# Patient Record
Sex: Male | Born: 1960 | Race: Black or African American | Hispanic: No | Marital: Single | Smoking: Current some day smoker
Health system: Southern US, Community
[De-identification: ages and names within clinical notes are randomized; demographics above are authoritative.]

## PROBLEM LIST (undated history)

## (undated) DIAGNOSIS — G5603 Carpal tunnel syndrome, bilateral upper limbs: Secondary | ICD-10-CM

## (undated) DIAGNOSIS — B182 Chronic viral hepatitis C: Secondary | ICD-10-CM

## (undated) DIAGNOSIS — M1990 Unspecified osteoarthritis, unspecified site: Secondary | ICD-10-CM

---

## 2012-11-03 ENCOUNTER — Emergency Department (HOSPITAL_COMMUNITY)
Admission: EM | Admit: 2012-11-03 | Discharge: 2012-11-03 | Disposition: A | Discharge status: Home / Self Care | Payer: No Typology Code available for payment source | Attending: Emergency Medicine | Admitting: Emergency Medicine

## 2012-11-03 ENCOUNTER — Encounter (HOSPITAL_COMMUNITY): Payer: Self-pay | Admitting: Cardiology

## 2012-11-03 DIAGNOSIS — F17200 Nicotine dependence, unspecified, uncomplicated: Secondary | ICD-10-CM | POA: Insufficient documentation

## 2012-11-03 DIAGNOSIS — M2569 Stiffness of other specified joint, not elsewhere classified: Secondary | ICD-10-CM | POA: Insufficient documentation

## 2012-11-03 DIAGNOSIS — S161XXA Strain of muscle, fascia and tendon at neck level, initial encounter: Secondary | ICD-10-CM

## 2012-11-03 DIAGNOSIS — S139XXA Sprain of joints and ligaments of unspecified parts of neck, initial encounter: Secondary | ICD-10-CM | POA: Insufficient documentation

## 2012-11-03 DIAGNOSIS — Y9389 Activity, other specified: Secondary | ICD-10-CM | POA: Insufficient documentation

## 2012-11-03 DIAGNOSIS — Y92410 Unspecified street and highway as the place of occurrence of the external cause: Secondary | ICD-10-CM | POA: Insufficient documentation

## 2012-11-03 MED ORDER — METHOCARBAMOL 500 MG PO TABS: 500.0000 mg | ORAL_TABLET | Freq: Two times a day (BID) | ORAL | Status: DC

## 2012-11-03 MED ORDER — NAPROXEN 500 MG PO TABS: 500.0000 mg | ORAL_TABLET | Freq: Two times a day (BID) | ORAL | Status: DC
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
## 2012-11-04 NOTE — ED Provider Notes (Signed)
Medical screening examination/treatment/procedure(s) were performed by non-physician practitioner and as supervising physician I was immediately available for consultation/collaboration.  Gerica Koble B. Bernette Mayers, MD 11/04/12 1610

## 2012-11-06 ENCOUNTER — Emergency Department (HOSPITAL_COMMUNITY)
Admission: EM | Admit: 2012-11-06 | Discharge: 2012-11-06 | Disposition: A | Discharge status: Home / Self Care | Payer: No Typology Code available for payment source | Attending: Emergency Medicine | Admitting: Emergency Medicine

## 2012-11-06 ENCOUNTER — Encounter (HOSPITAL_COMMUNITY): Payer: Self-pay | Admitting: Emergency Medicine

## 2012-11-06 ENCOUNTER — Emergency Department (HOSPITAL_COMMUNITY): Payer: No Typology Code available for payment source

## 2012-11-06 DIAGNOSIS — M542 Cervicalgia: Secondary | ICD-10-CM | POA: Insufficient documentation

## 2012-11-06 DIAGNOSIS — F17200 Nicotine dependence, unspecified, uncomplicated: Secondary | ICD-10-CM | POA: Insufficient documentation

## 2012-11-06 DIAGNOSIS — G8911 Acute pain due to trauma: Secondary | ICD-10-CM | POA: Insufficient documentation

## 2012-11-06 MED ORDER — METHOCARBAMOL 500 MG PO TABS: 500.0000 mg | ORAL_TABLET | Freq: Two times a day (BID) | ORAL | Status: DC

## 2012-11-06 NOTE — ED Notes (Signed)
Patient presents to ED today with complaints of neck pain. Was seen here on the 7th for the same.
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
## 2012-11-07 NOTE — ED Provider Notes (Signed)
Medical screening examination/treatment/procedure(s) were performed by non-physician practitioner and as supervising physician I was immediately available for consultation/collaboration.  Braelin Costlow L Denise Washburn, MD 11/07/12 1017 

## 2013-01-02 ENCOUNTER — Emergency Department: Payer: Self-pay | Admitting: Emergency Medicine

## 2013-05-25 ENCOUNTER — Emergency Department: Payer: Self-pay | Admitting: Emergency Medicine

## 2014-03-16 ENCOUNTER — Emergency Department (HOSPITAL_COMMUNITY)
Admission: EM | Admit: 2014-03-16 | Discharge: 2014-03-16 | Disposition: A | Discharge status: Home / Self Care | Payer: Self-pay | Attending: Emergency Medicine | Admitting: Emergency Medicine

## 2014-03-16 ENCOUNTER — Encounter (HOSPITAL_COMMUNITY): Payer: Self-pay | Admitting: *Deleted

## 2014-03-16 ENCOUNTER — Emergency Department (HOSPITAL_COMMUNITY): Payer: No Typology Code available for payment source

## 2014-03-16 DIAGNOSIS — Z79899 Other long term (current) drug therapy: Secondary | ICD-10-CM | POA: Insufficient documentation

## 2014-03-16 DIAGNOSIS — Z791 Long term (current) use of non-steroidal anti-inflammatories (NSAID): Secondary | ICD-10-CM | POA: Insufficient documentation

## 2014-03-16 DIAGNOSIS — J029 Acute pharyngitis, unspecified: Secondary | ICD-10-CM | POA: Insufficient documentation

## 2014-03-16 DIAGNOSIS — R0602 Shortness of breath: Secondary | ICD-10-CM | POA: Insufficient documentation

## 2014-03-16 DIAGNOSIS — R0789 Other chest pain: Secondary | ICD-10-CM | POA: Insufficient documentation

## 2014-03-16 DIAGNOSIS — H9203 Otalgia, bilateral: Secondary | ICD-10-CM | POA: Insufficient documentation

## 2014-03-16 DIAGNOSIS — R197 Diarrhea, unspecified: Secondary | ICD-10-CM | POA: Insufficient documentation

## 2014-03-16 DIAGNOSIS — Z8619 Personal history of other infectious and parasitic diseases: Secondary | ICD-10-CM | POA: Insufficient documentation

## 2014-03-16 DIAGNOSIS — Z720 Tobacco use: Secondary | ICD-10-CM | POA: Insufficient documentation

## 2014-03-16 DIAGNOSIS — R6889 Other general symptoms and signs: Secondary | ICD-10-CM

## 2014-03-16 HISTORY — DX: Chronic viral hepatitis C: B18.2

## 2014-03-16 LAB — RAPID STREP SCREEN (MED CTR MEBANE ONLY): STREPTOCOCCUS, GROUP A SCREEN (DIRECT): NEGATIVE

## 2014-03-16 LAB — CBC WITH DIFFERENTIAL/PLATELET
BASOS ABS: 0 10*3/uL (ref 0.0–0.1)
Basophils Relative: 0 % (ref 0–1)
Eosinophils Absolute: 0.1 10*3/uL (ref 0.0–0.7)
Eosinophils Relative: 3 % (ref 0–5)
HEMATOCRIT: 36.5 % — AB (ref 39.0–52.0)
Hemoglobin: 12.3 g/dL — ABNORMAL LOW (ref 13.0–17.0)
LYMPHS PCT: 40 % (ref 12–46)
Lymphs Abs: 1.7 10*3/uL (ref 0.7–4.0)
MCH: 32.5 pg (ref 26.0–34.0)
MCHC: 33.7 g/dL (ref 30.0–36.0)
MCV: 96.3 fL (ref 78.0–100.0)
MONO ABS: 0.6 10*3/uL (ref 0.1–1.0)
Monocytes Relative: 13 % — ABNORMAL HIGH (ref 3–12)
NEUTROS ABS: 1.9 10*3/uL (ref 1.7–7.7)
NEUTROS PCT: 44 % (ref 43–77)
PLATELETS: 223 10*3/uL (ref 150–400)
RBC: 3.79 MIL/uL — ABNORMAL LOW (ref 4.22–5.81)
RDW: 13.9 % (ref 11.5–15.5)
WBC: 4.3 10*3/uL (ref 4.0–10.5)

## 2014-03-16 LAB — COMPREHENSIVE METABOLIC PANEL
ALK PHOS: 68 U/L (ref 39–117)
ALT: 129 U/L — AB (ref 0–53)
AST: 71 U/L — AB (ref 0–37)
Albumin: 3.6 g/dL (ref 3.5–5.2)
Anion gap: 12 (ref 5–15)
BILIRUBIN TOTAL: 0.4 mg/dL (ref 0.3–1.2)
BUN: 21 mg/dL (ref 6–23)
CHLORIDE: 102 meq/L (ref 96–112)
CO2: 25 meq/L (ref 19–32)
Calcium: 9.4 mg/dL (ref 8.4–10.5)
Creatinine, Ser: 0.8 mg/dL (ref 0.50–1.35)
GFR calc Af Amer: >90 mL/min (ref >90)
Glucose, Bld: 94 mg/dL (ref 70–99)
POTASSIUM: 4.2 meq/L (ref 3.7–5.3)
SODIUM: 139 meq/L (ref 137–147)
Total Protein: 7.3 g/dL (ref 6.0–8.3)

## 2014-03-16 LAB — TROPONIN I: Troponin I: <0.3 ng/mL (ref <0.30)

## 2014-03-16 MED ORDER — IPRATROPIUM-ALBUTEROL 0.5-2.5 (3) MG/3ML IN SOLN
3.0000 mL | RESPIRATORY_TRACT | Status: DC
  Administered 2014-03-16: 3 mL via RESPIRATORY_TRACT
  Filled 2014-03-16: qty 3

## 2014-03-16 MED ORDER — NAPROXEN 500 MG PO TABS: 500.0000 mg | ORAL_TABLET | Freq: Two times a day (BID) | ORAL | Status: DC

## 2014-03-16 MED ORDER — GUAIFENESIN 100 MG/5ML PO LIQD: 100.0000 mg | ORAL | Status: DC | PRN

## 2014-03-16 MED ORDER — GI COCKTAIL ~~LOC~~
30.0000 mL | Freq: Once | ORAL | Status: AC
  Administered 2014-03-16: 30 mL via ORAL
  Filled 2014-03-16: qty 30

## 2014-03-16 NOTE — ED Notes (Signed)
Patient transported to X-ray 

## 2014-03-16 NOTE — Discharge Instructions (Signed)
Please follow up with your primary care physician in 1-2 days. If you do not have one please call the [facility] number listed above. Please take medications as prescribed. Please read all discharge instructions and return precautions.   Influenza Influenza ("the flu") is a viral infection of the respiratory tract. It occurs more often in winter months because people spend more time in close contact with one another. Influenza can make you feel very sick. Influenza easily spreads from person to person (contagious). CAUSES  Influenza is caused by a virus that infects the respiratory tract. You can catch the virus by breathing in droplets from an infected person's cough or sneeze. You can also catch the virus by touching something that was recently contaminated with the virus and then touching your mouth, nose, or eyes. RISKS AND COMPLICATIONS You may be at risk for a more severe case of influenza if you smoke cigarettes, have diabetes, have chronic heart disease (such as heart failure) or lung disease (such as asthma), or if you have a weakened immune system. Elderly people and pregnant women are also at risk for more serious infections. The most common problem of influenza is a lung infection (pneumonia). Sometimes, this problem can require emergency medical care and may be life threatening. SIGNS AND SYMPTOMS  Symptoms typically last 4 to 10 days and may include:  Fever.  Chills.  Headache, body aches, and muscle aches.  Sore throat.  Chest discomfort and cough.  Poor appetite.  Weakness or feeling tired.  Dizziness.  Nausea or vomiting. DIAGNOSIS  Diagnosis of influenza is often made based on your history and a physical exam. A nose or throat swab test can be done to confirm the diagnosis. TREATMENT  In mild cases, influenza goes away on its own. Treatment is directed at relieving symptoms. For more severe cases, your health care provider may prescribe antiviral  medicines to shorten the sickness. Antibiotic medicines are not effective because the infection is caused by a virus, not by bacteria. HOME CARE INSTRUCTIONS  Take medicines only as directed by your health care provider.  Use a cool mist humidifier to make breathing easier.  Get plenty of rest until your temperature returns to normal. This usually takes 3 to 4 days.  Drink enough fluid to keep your urine clear or pale yellow.  Cover yourmouth and nosewhen coughing or sneezing,and wash your handswellto prevent thevirusfrom spreading.  Stay homefromwork orschool untilthe fever is gonefor at least 48full day. PREVENTION  An annual influenza vaccination (flu shot) is the best way to avoid getting influenza. An annual flu shot is now routinely recommended for all adults in the [place] IF:  You experiencechest pain, yourcough worsens,or you producemore mucus.  Youhave nausea,vomiting, ordiarrhea.  Your fever returns or gets worse. SEEK IMMEDIATE MEDICAL CARE IF:  You havetrouble breathing, you become short of breath,or your skin ornails becomebluish.  You have severe painor stiffnessin the neck.  You develop a sudden headache, or pain in the face or ear.  You have nausea or vomiting that you cannot control. MAKE SURE YOU:   Understand these instructions.  Will watch your condition.  Will get help right away if you are not doing well or get worse. Document Released: 03/13/2000 Document Revised: 07/31/2013 Document Reviewed: 06/15/2011 [facility] Patient Information 2015 [place], [place]. This information is not intended to replace advice given to you by your health care provider. Make sure you discuss any questions you have with your health  care provider.

## 2014-03-16 NOTE — ED Notes (Signed)
PA at the bedside.

## 2014-03-16 NOTE — ED Notes (Signed)
Patient returned from X-ray 

## 2014-03-16 NOTE — ED Notes (Signed)
The pt keeps clearing his throat and he feels like its closing up on him.  No resp distress

## 2014-03-16 NOTE — ED Notes (Signed)
The pt is c/o having a cold sorethroat  Chills and poss fever with dizziness foir 2-3 daYS .Marland Kitchen Non-productive cough  .

## 2014-03-16 NOTE — ED Provider Notes (Signed)
CSN: [id number]     Arrival date & time 03/16/14  2505 History   First MD Initiated Contact with Patient 03/16/14 1840     Chief Complaint  Patient presents with  . Dizziness     (Consider location/radiation/quality/duration/timing/severity/associated sxs/prior Treatment) HPI Comments: Patient is a 53 yo M PMHx significant for Hep C presenting to the ED for two days of subjective fevers, chills, non productive cough, sore throat, congestion, bilateral ear pain, nonbloody diarrhea. He states today he felt lightheaded and had some posttussive chest tightness and shortness of breath. He has tried 1 dose of a cough suppressant with no improvement. No modifying factors identified. He states he has positive sick contacts at home. He denies any nausea, vomiting, abdominal pain. 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  Patient is a 53 y.o. male presenting with dizziness.  Dizziness Associated symptoms: diarrhea and shortness of breath   Associated symptoms: no blood in stool, no nausea and no vomiting     Past Medical History  Diagnosis Date  . Hep C w/o coma, chronic    History reviewed. No pertinent past surgical history. No family history on file. History  Substance Use Topics  . Smoking status: Current Every Day Smoker    Types: Cigarettes  . Smokeless tobacco: Not on file  . Alcohol Use: No    Review of Systems  Constitutional: Positive for fever (subjective) and chills.  Respiratory: Positive for cough, chest tightness and shortness of breath.   Gastrointestinal: Positive for diarrhea. Negative for nausea, vomiting, abdominal pain, blood in stool and anal bleeding.  Musculoskeletal: Positive for myalgias and arthralgias.  Neurological: Positive for dizziness and light-headedness.  All other systems reviewed and are negative.     Allergies  Ibuprofen  Home Medications   Prior to Admission medications   Medication Sig Start Date End Date  Taking? Authorizing Provider  methocarbamol (ROBAXIN) 500 MG tablet Take 1 tablet (500 mg total) by mouth 2 (two) times daily. 2 tablets up to 4 times a day 11/06/12   Elmyra Ricks Pisciotta, PA-C  naproxen (NAPROSYN) 500 MG tablet Take 1 tablet (500 mg total) by mouth 2 (two) times daily. 11/03/12   Heather Laisure, PA-C   BP 130/80 mmHg  Pulse 72  Temp(Src) 98.1 F (36.7 C) (Oral)  Resp 17  SpO2 99% Physical Exam  Constitutional: He is oriented to person, place, and time. He appears well-developed and well-nourished. No distress.  HENT:  Head: Normocephalic and atraumatic.  Right Ear: Hearing, tympanic membrane, external ear and ear canal normal.  Left Ear: Hearing, tympanic membrane, external ear and ear canal normal.  Nose: Rhinorrhea present.  Mouth/Throat: Uvula is midline and mucous membranes are normal. Posterior oropharyngeal erythema present. No oropharyngeal exudate, posterior oropharyngeal edema or tonsillar abscesses.  Eyes: Conjunctivae are normal.  Neck: Normal range of motion. Neck supple.  Cardiovascular: Normal rate, regular rhythm and normal heart sounds.   Pulmonary/Chest: Effort normal and breath sounds normal. No accessory muscle usage. No respiratory distress.  Cough on examination.   Abdominal: Soft. There is no tenderness.  Musculoskeletal: Normal range of motion.  Lymphadenopathy:    He has cervical adenopathy.  Neurological: He is alert and oriented to person, place, and time.  Skin: Skin is warm and dry. He is not diaphoretic.  Psychiatric: He has a normal mood and affect.  Nursing note and vitals reviewed.   ED Course  Procedures (including critical care time) Medications  ipratropium-albuterol (  DUONEB) 0.5-2.5 (3) MG/3ML nebulizer solution 3 mL (not administered)    Labs Review Labs Reviewed - No data to display  Imaging Review No results found.   EKG Interpretation None      MDM   Final diagnoses:  None    Filed Vitals:   03/16/14 2145   BP: 146/73  Pulse: 81  Temp:   Resp:    Afebrile, NAD, non-toxic appearing, AAOx4.  Patient with symptoms consistent with influenza.  Vitals are stable, low-grade fever.  No signs of dehydration, tolerating PO's.  Lungs are clear. CXR negative. Rapid strep negative. Labs reviewed.  Discussed the cost versus benefit of Tamiflu treatment with the patient.  The patient understands that symptoms are greater than the recommended 24-48 hour window of treatment.  Patient will be discharged with instructions to orally hydrate, rest.  Patient will also be given a cough suppressant.  Patient is stable at time of discharge     Harlow Mares, PA-C 03/16/14 [place], MD 03/16/14 2234

## 2014-03-18 LAB — CULTURE, GROUP A STREP

## 2014-05-13 ENCOUNTER — Emergency Department (HOSPITAL_COMMUNITY): Payer: Self-pay

## 2014-05-13 ENCOUNTER — Emergency Department (HOSPITAL_COMMUNITY)
Admission: EM | Admit: 2014-05-13 | Discharge: 2014-05-13 | Disposition: A | Discharge status: Home / Self Care | Payer: Self-pay | Attending: Emergency Medicine | Admitting: Emergency Medicine

## 2014-05-13 ENCOUNTER — Encounter (HOSPITAL_COMMUNITY): Payer: Self-pay | Admitting: Emergency Medicine

## 2014-05-13 DIAGNOSIS — R42 Dizziness and giddiness: Secondary | ICD-10-CM | POA: Insufficient documentation

## 2014-05-13 DIAGNOSIS — Z87891 Personal history of nicotine dependence: Secondary | ICD-10-CM | POA: Insufficient documentation

## 2014-05-13 DIAGNOSIS — R51 Headache: Secondary | ICD-10-CM | POA: Insufficient documentation

## 2014-05-13 DIAGNOSIS — Z79899 Other long term (current) drug therapy: Secondary | ICD-10-CM | POA: Insufficient documentation

## 2014-05-13 DIAGNOSIS — Z792 Long term (current) use of antibiotics: Secondary | ICD-10-CM | POA: Insufficient documentation

## 2014-05-13 DIAGNOSIS — Z8619 Personal history of other infectious and parasitic diseases: Secondary | ICD-10-CM | POA: Insufficient documentation

## 2014-05-13 LAB — CBC WITH DIFFERENTIAL/PLATELET
BASOS ABS: 0 10*3/uL (ref 0.0–0.1)
BASOS PCT: 0 % (ref 0–1)
Eosinophils Absolute: 0.1 10*3/uL (ref 0.0–0.7)
Eosinophils Relative: 3 % (ref 0–5)
HEMATOCRIT: 38.5 % — AB (ref 39.0–52.0)
HEMOGLOBIN: 13.4 g/dL (ref 13.0–17.0)
Lymphocytes Relative: 62 % — ABNORMAL HIGH (ref 12–46)
Lymphs Abs: 2.5 10*3/uL (ref 0.7–4.0)
MCH: 32.3 pg (ref 26.0–34.0)
MCHC: 34.8 g/dL (ref 30.0–36.0)
MCV: 92.8 fL (ref 78.0–100.0)
MONOS PCT: 11 % (ref 3–12)
Monocytes Absolute: 0.4 10*3/uL (ref 0.1–1.0)
NEUTROS ABS: 0.9 10*3/uL — AB (ref 1.7–7.7)
NEUTROS PCT: 24 % — AB (ref 43–77)
PLATELETS: 233 10*3/uL (ref 150–400)
RBC: 4.15 MIL/uL — AB (ref 4.22–5.81)
RDW: 12.3 % (ref 11.5–15.5)
WBC: 3.9 10*3/uL — ABNORMAL LOW (ref 4.0–10.5)

## 2014-05-13 LAB — I-STAT CHEM 8, ED
BUN: 15 mg/dL (ref 6–23)
CALCIUM ION: 1.2 mmol/L (ref 1.12–1.23)
CHLORIDE: 101 mmol/L (ref 96–112)
Creatinine, Ser: 0.8 mg/dL (ref 0.50–1.35)
GLUCOSE: 91 mg/dL (ref 70–99)
HCT: 43 % (ref 39.0–52.0)
Hemoglobin: 14.6 g/dL (ref 13.0–17.0)
Potassium: 3.9 mmol/L (ref 3.5–5.1)
SODIUM: 140 mmol/L (ref 135–145)
TCO2: 26 mmol/L (ref 0–100)

## 2014-05-13 LAB — COMPREHENSIVE METABOLIC PANEL
ALK PHOS: 59 U/L (ref 39–117)
ALT: 135 U/L — ABNORMAL HIGH (ref 0–53)
AST: 89 U/L — AB (ref 0–37)
Albumin: 3.9 g/dL (ref 3.5–5.2)
Anion gap: 6 (ref 5–15)
BILIRUBIN TOTAL: 0.8 mg/dL (ref 0.3–1.2)
BUN: 12 mg/dL (ref 6–23)
CALCIUM: 9.6 mg/dL (ref 8.4–10.5)
CO2: 27 mmol/L (ref 19–32)
CREATININE: 0.89 mg/dL (ref 0.50–1.35)
Chloride: 103 mmol/L (ref 96–112)
GFR calc Af Amer: >90 mL/min (ref >90)
GFR calc non Af Amer: >90 mL/min (ref >90)
GLUCOSE: 90 mg/dL (ref 70–99)
Potassium: 3.9 mmol/L (ref 3.5–5.1)
Sodium: 136 mmol/L (ref 135–145)
TOTAL PROTEIN: 7.1 g/dL (ref 6.0–8.3)

## 2014-05-13 LAB — I-STAT TROPONIN, ED: Troponin i, poc: 0 ng/mL (ref 0.00–0.08)

## 2014-05-13 MED ORDER — MECLIZINE HCL 25 MG PO TABS: 12.5000 mg | ORAL_TABLET | Freq: Three times a day (TID) | ORAL | Status: DC | PRN

## 2014-05-13 MED ORDER — MECLIZINE HCL 25 MG PO TABS
25.0000 mg | ORAL_TABLET | Freq: Once | ORAL | Status: AC
  Administered 2014-05-13: 25 mg via ORAL
  Filled 2014-05-13: qty 1

## 2014-05-13 MED ORDER — METOCLOPRAMIDE HCL 5 MG/ML IJ SOLN
10.0000 mg | Freq: Once | INTRAMUSCULAR | Status: AC
  Administered 2014-05-13: 10 mg via INTRAVENOUS
  Filled 2014-05-13: qty 2

## 2014-05-13 MED ORDER — DIPHENHYDRAMINE HCL 50 MG/ML IJ SOLN
25.0000 mg | Freq: Once | INTRAMUSCULAR | Status: AC
  Administered 2014-05-13: 25 mg via INTRAVENOUS
  Filled 2014-05-13: qty 1

## 2014-05-13 NOTE — ED Provider Notes (Addendum)
CSN: [id number]     Arrival date & time 05/13/14  1709 History   First MD Initiated Contact with Patient 05/13/14 1723     Chief Complaint  Patient presents with  . Dizziness     (Consider location/radiation/quality/duration/timing/severity/associated sxs/prior Treatment) The history is provided by the patient.  Richard Bauer is a 54 y.o. male history of hep C here presenting with dizziness. He describes it as room spinning especially when he stands up. He says that he feels weak and felt like he was going to fall but did not pass out or fall. Denies trouble speaking or numbness. Denies chest pain or shortness of breath or lightheadedness. He has intermittent headaches as well.     Past Medical History  Diagnosis Date  . Hep C w/o coma, chronic    History reviewed. No pertinent past surgical history. No family history on file. History  Substance Use Topics  . Smoking status: Former Smoker    Types: Cigarettes  . Smokeless tobacco: Not on file  . Alcohol Use: No    Review of Systems  Neurological: Positive for dizziness and headaches.  All other systems reviewed and are negative.     Allergies  Ibuprofen  Home Medications   Prior to Admission medications   Medication Sig Start Date End Date Taking? Authorizing Provider  guaiFENesin (ROBITUSSIN) 100 MG/5ML liquid Take 5-10 mLs (100-200 mg total) by mouth every 4 (four) hours as needed for cough. 03/16/14  Yes Jennifer L Piepenbrink, PA-C  methocarbamol (ROBAXIN) 500 MG tablet Take 1 tablet (500 mg total) by mouth 2 (two) times daily. 2 tablets up to 4 times a day 11/06/12  Yes Nicole Pisciotta, PA-C  naproxen (NAPROSYN) 500 MG tablet Take 1 tablet (500 mg total) by mouth 2 (two) times daily. 03/16/14  Yes Jennifer L Piepenbrink, PA-C  QUEtiapine (SEROQUEL) 200 MG tablet Take 200 mg by mouth at bedtime.   Yes Historical Provider, MD   BP 106/69 mmHg  Pulse 56  Temp(Src) 98.1 F (36.7 C) (Oral)  Resp 10  Ht 5\' 9"   (1.753 m)  Wt 165 lb (74.844 kg)  BMI 24.36 kg/m2  SpO2 96% Physical Exam  Constitutional: He is oriented to person, place, and time. He appears well-developed and well-nourished.  HENT:  Head: Normocephalic.  Right Ear: External ear normal.  Left Ear: External ear normal.  Mouth/Throat: Oropharynx is clear and moist.  Eyes: Conjunctivae and EOM are normal. Pupils are equal, round, and reactive to light.  Subtle leftward nystagmus, not changing direction. No rotatory or vertical nystagmus   Neck: Normal range of motion. Neck supple.  Cardiovascular: Normal rate, regular rhythm and normal heart sounds.   Pulmonary/Chest: Effort normal and breath sounds normal. No respiratory distress. He has no wheezes. He has no rales.  Abdominal: Soft. Bowel sounds are normal. He exhibits no distension. There is no tenderness. There is no rebound and no guarding.  Musculoskeletal: Normal range of motion. He exhibits no edema or tenderness.  Neurological: He is alert and oriented to person, place, and time.  CN 2-12 intact, nl finger to nose, no pronator drift. Nl strength throughout. Nl gait.   Skin: Skin is warm and dry.  Psychiatric: He has a normal mood and affect. His behavior is normal. Judgment and thought content normal.  Nursing note and vitals reviewed.   ED Course  Procedures (including critical care time) Labs Review Labs Reviewed  CBC WITH DIFFERENTIAL/PLATELET - Abnormal; Notable for the following:    WBC  3.9 (*)    RBC 4.15 (*)    HCT 38.5 (*)    Neutrophils Relative % 24 (*)    Lymphocytes Relative 62 (*)    Neutro Abs 0.9 (*)    All other components within normal limits  COMPREHENSIVE METABOLIC PANEL - Abnormal; Notable for the following:    AST 89 (*)    ALT 135 (*)    All other components within normal limits  CBG MONITORING, ED  I-STAT CHEM 8, ED  I-STAT TROPOININ, ED    Imaging Review Ct Head Wo Contrast  05/13/2014   CLINICAL DATA:  Three-day history of headache  and dizziness  EXAM: CT HEAD WITHOUT CONTRAST  TECHNIQUE: Contiguous axial images were obtained from the base of the skull through the vertex without intravenous contrast.  COMPARISON:  None.  FINDINGS: The ventricles are normal in size and configuration. There is no mass, hemorrhage, extra-axial fluid collection, or midline shift. Gray-white compartments appear normal. No acute infarct. The bony calvarium appears intact. The mastoid air cells are clear. There are retention cysts in the left maxillary antrum. There is mucosal thickening in several ethmoid air cells bilaterally. Mucosal thickening is also noted in the lateral aspect of the right sphenoid sinus. There is mild leftward deviation of the nasal septum.  IMPRESSION: No intracranial mass, hemorrhage, or focal gray -white compartment lesions/acute appearing infarct. Areas of paranasal sinus disease.   Electronically Signed   By: Lowella Grip III M.D.   On: 05/13/2014 18:51     EKG Interpretation   Date/Time:  Sunday May 13 2014 17:15:55 EST Ventricular Rate:  65 PR Interval:  156 QRS Duration: 90 QT Interval:  396 QTC Calculation: 411 R Axis:   -65 Text Interpretation:  Normal sinus rhythm Left axis deviation Abnormal ECG  No significant change since last tracing Confirmed by Aysia Lowder  MD, Carlton Buskey  ([id number]) on 05/13/2014 5:23:04 PM      MDM   Final diagnoses:  None    Richard Bauer is a 54 y.o. male here with dizziness. Likely BPPV. Neuro exam reassuring. Will get baseline labs, CT head. Will not need MRI if CT neg. Leg strength normal and no saddle anesthesia. Will give meclizine and reassess.   8:12 PM Felt better after meclizine. Steady gait. Will dc home with meclizine.   Wandra Arthurs, MD 05/13/14 2013  Wandra Arthurs, MD 05/13/14 2016

## 2014-05-13 NOTE — ED Notes (Signed)
EKG completed in triage.

## 2014-05-13 NOTE — ED Notes (Signed)
Patient returned from CT

## 2014-05-13 NOTE — Discharge Instructions (Signed)
Take meclizine as needed for dizziness.   Stay hydrated.   Follow up with your doctor.   Return to ER if you have worse dizziness, falling, weakness.

## 2014-05-13 NOTE — ED Notes (Signed)
Pt cf/o dizziness x 3 days. Pt denies N/V. Reports weakness to B/L legs.

## 2014-12-14 ENCOUNTER — Emergency Department (INDEPENDENT_AMBULATORY_CARE_PROVIDER_SITE_OTHER)
Admission: EM | Admit: 2014-12-14 | Discharge: 2014-12-14 | Disposition: A | Discharge status: Home / Self Care | Payer: Self-pay | Source: Home / Self Care | Attending: Family Medicine | Admitting: Family Medicine

## 2014-12-14 ENCOUNTER — Encounter (HOSPITAL_COMMUNITY): Payer: Self-pay | Admitting: Emergency Medicine

## 2014-12-14 DIAGNOSIS — R21 Rash and other nonspecific skin eruption: Secondary | ICD-10-CM

## 2014-12-14 MED ORDER — PERMETHRIN 5 % EX CREA: TOPICAL_CREAM | CUTANEOUS | Status: DC

## 2014-12-14 NOTE — ED Notes (Signed)
C/o rash on bilateral arms onset 1 week Also reports he needs a refill on his asthma rescue inhalers Alert and oriented x4... No acute distress.

## 2014-12-14 NOTE — Discharge Instructions (Signed)
Likely have contracted scabies. Please use the cream as described tonight and in 2 weeks. Please use Benadryl 25-50 mg for additional itch relief.  Scabies Scabies are small bugs (mites) that burrow under the skin and cause red bumps and severe itching. These bugs can only be seen with a microscope. Scabies are highly contagious. They can spread easily from person to person by direct contact. They are also spread through sharing clothing or linens that have the scabies mites living in them. It is not unusual for an entire family to become infected through shared towels, clothing, or bedding.  HOME CARE INSTRUCTIONS   Your caregiver may prescribe a cream or lotion to kill the mites. If cream is prescribed, massage the cream into the entire body from the neck to the bottom of both feet. Also massage the cream into the scalp and face if your child is less than 24 year old. Avoid the eyes and mouth. Do not wash your hands after application.  Leave the cream on for 8 to 12 hours. Your child should bathe or shower after the 8 to 12 hour application period. Sometimes it is helpful to apply the cream to your child right before bedtime.  One treatment is usually effective and will eliminate approximately 95% of infestations. For severe cases, your caregiver may decide to repeat the treatment in 1 week. Everyone in your household should be treated with one application of the cream.  New rashes or burrows should not appear within 24 to 48 hours after successful treatment. However, the itching and rash may last for 2 to 4 weeks after successful treatment. Your caregiver may prescribe a medicine to help with the itching or to help the rash go away more quickly.  Scabies can live on clothing or linens for up to 3 days. All of your child's recently used clothing, towels, stuffed toys, and bed linens should be washed in hot water and then dried in a dryer for at least 20 minutes on high heat. Items that cannot be  washed should be enclosed in a plastic bag for at least 3 days.  To help relieve itching, bathe your child in a cool bath or apply cool washcloths to the affected areas.  Your child may return to school after treatment with the prescribed cream. SEEK MEDICAL CARE IF:   The itching persists longer than 4 weeks after treatment.  The rash spreads or becomes infected. Signs of infection include red blisters or yellow-tan crust. Document Released: 03/16/2005 Document Revised: 06/08/2011 Document Reviewed: 07/25/2008 [facility] Patient Information 2015 [place], [place]. This information is not intended to replace advice given to you by your health care provider. Make sure you discuss any questions you have with your health care provider.
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
## 2015-07-01 ENCOUNTER — Emergency Department (HOSPITAL_COMMUNITY)
Admission: EM | Admit: 2015-07-01 | Discharge: 2015-07-01 | Disposition: A | Discharge status: Other Acute Inpatient Hospital | Payer: No Typology Code available for payment source | Attending: Emergency Medicine | Admitting: Emergency Medicine

## 2015-07-01 ENCOUNTER — Encounter (HOSPITAL_COMMUNITY): Payer: Self-pay | Admitting: *Deleted

## 2015-07-01 DIAGNOSIS — Y999 Unspecified external cause status: Secondary | ICD-10-CM | POA: Insufficient documentation

## 2015-07-01 DIAGNOSIS — T20212A Burn of second degree of left ear [any part, except ear drum], initial encounter: Secondary | ICD-10-CM | POA: Insufficient documentation

## 2015-07-01 DIAGNOSIS — Z791 Long term (current) use of non-steroidal anti-inflammatories (NSAID): Secondary | ICD-10-CM | POA: Insufficient documentation

## 2015-07-01 DIAGNOSIS — Z79899 Other long term (current) drug therapy: Secondary | ICD-10-CM | POA: Insufficient documentation

## 2015-07-01 DIAGNOSIS — Y9389 Activity, other specified: Secondary | ICD-10-CM | POA: Insufficient documentation

## 2015-07-01 DIAGNOSIS — H5411 Blindness, right eye, low vision left eye: Secondary | ICD-10-CM | POA: Insufficient documentation

## 2015-07-01 DIAGNOSIS — Y9289 Other specified places as the place of occurrence of the external cause: Secondary | ICD-10-CM | POA: Insufficient documentation

## 2015-07-01 DIAGNOSIS — T2641XA Burn of right eye and adnexa, part unspecified, initial encounter: Secondary | ICD-10-CM | POA: Insufficient documentation

## 2015-07-01 DIAGNOSIS — T22211A Burn of second degree of right forearm, initial encounter: Secondary | ICD-10-CM | POA: Insufficient documentation

## 2015-07-01 DIAGNOSIS — Z8619 Personal history of other infectious and parasitic diseases: Secondary | ICD-10-CM | POA: Insufficient documentation

## 2015-07-01 DIAGNOSIS — T2642XA Burn of left eye and adnexa, part unspecified, initial encounter: Secondary | ICD-10-CM

## 2015-07-01 DIAGNOSIS — T22232A Burn of second degree of left upper arm, initial encounter: Secondary | ICD-10-CM | POA: Insufficient documentation

## 2015-07-01 DIAGNOSIS — Z87891 Personal history of nicotine dependence: Secondary | ICD-10-CM | POA: Insufficient documentation

## 2015-07-01 DIAGNOSIS — T2029XA Burn of second degree of multiple sites of head, face, and neck, initial encounter: Secondary | ICD-10-CM | POA: Insufficient documentation

## 2015-07-01 DIAGNOSIS — W408XXA Explosion of other specified explosive materials, initial encounter: Secondary | ICD-10-CM | POA: Insufficient documentation

## 2015-07-01 MED ORDER — MORPHINE SULFATE (PF) 4 MG/ML IV SOLN
4.0000 mg | Freq: Once | INTRAVENOUS | Status: AC
  Administered 2015-07-01: 4 mg via INTRAVENOUS
  Filled 2015-07-01: qty 1
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
## 2015-07-02 DIAGNOSIS — T300 Burn of unspecified body region, unspecified degree: Secondary | ICD-10-CM | POA: Insufficient documentation

## 2015-09-29 ENCOUNTER — Emergency Department (HOSPITAL_COMMUNITY): Payer: Self-pay

## 2015-09-29 ENCOUNTER — Encounter (HOSPITAL_COMMUNITY): Payer: Self-pay

## 2015-09-29 ENCOUNTER — Emergency Department (HOSPITAL_COMMUNITY): Payer: Self-pay | Admitting: Anesthesiology

## 2015-09-29 ENCOUNTER — Encounter (HOSPITAL_COMMUNITY)
Admission: EM | Disposition: A | Discharge status: Home / Self Care | Payer: Self-pay | Source: Home / Self Care | Attending: Emergency Medicine

## 2015-09-29 ENCOUNTER — Observation Stay (HOSPITAL_COMMUNITY)
Admission: EM | Admit: 2015-09-29 | Discharge: 2015-09-30 | Disposition: A | Discharge status: Home / Self Care | Payer: Self-pay | Attending: Otolaryngology | Admitting: Otolaryngology

## 2015-09-29 DIAGNOSIS — S0083XA Contusion of other part of head, initial encounter: Secondary | ICD-10-CM | POA: Insufficient documentation

## 2015-09-29 DIAGNOSIS — W109XXA Fall (on) (from) unspecified stairs and steps, initial encounter: Secondary | ICD-10-CM | POA: Insufficient documentation

## 2015-09-29 DIAGNOSIS — F319 Bipolar disorder, unspecified: Secondary | ICD-10-CM | POA: Insufficient documentation

## 2015-09-29 DIAGNOSIS — S02609A Fracture of mandible, unspecified, initial encounter for closed fracture: Principal | ICD-10-CM | POA: Insufficient documentation

## 2015-09-29 DIAGNOSIS — S0269XB Fracture of mandible of other specified site, initial encounter for open fracture: Secondary | ICD-10-CM | POA: Diagnosis present

## 2015-09-29 DIAGNOSIS — Z886 Allergy status to analgesic agent status: Secondary | ICD-10-CM | POA: Insufficient documentation

## 2015-09-29 DIAGNOSIS — M25559 Pain in unspecified hip: Secondary | ICD-10-CM

## 2015-09-29 DIAGNOSIS — B182 Chronic viral hepatitis C: Secondary | ICD-10-CM | POA: Insufficient documentation

## 2015-09-29 DIAGNOSIS — Z87891 Personal history of nicotine dependence: Secondary | ICD-10-CM | POA: Insufficient documentation

## 2015-09-29 HISTORY — PX: ORIF MANDIBULAR FRACTURE: SHX2127

## 2015-09-29 LAB — CBC
HEMATOCRIT: 38.3 % — AB (ref 39.0–52.0)
Hemoglobin: 12.7 g/dL — ABNORMAL LOW (ref 13.0–17.0)
MCH: 32.6 pg (ref 26.0–34.0)
MCHC: 33.2 g/dL (ref 30.0–36.0)
MCV: 98.2 fL (ref 78.0–100.0)
Platelets: 235 10*3/uL (ref 150–400)
RBC: 3.9 MIL/uL — AB (ref 4.22–5.81)
RDW: 13.5 % (ref 11.5–15.5)
WBC: 3.4 10*3/uL — AB (ref 4.0–10.5)

## 2015-09-29 LAB — BASIC METABOLIC PANEL
ANION GAP: 6 (ref 5–15)
BUN: 23 mg/dL — ABNORMAL HIGH (ref 6–20)
CO2: 27 mmol/L (ref 22–32)
Calcium: 9.8 mg/dL (ref 8.9–10.3)
Chloride: 107 mmol/L (ref 101–111)
Creatinine, Ser: 1.03 mg/dL (ref 0.61–1.24)
GFR calc non Af Amer: >60 mL/min (ref >60)
Glucose, Bld: 122 mg/dL — ABNORMAL HIGH (ref 65–99)
POTASSIUM: 3.8 mmol/L (ref 3.5–5.1)
Sodium: 140 mmol/L (ref 135–145)

## 2015-09-29 SURGERY — OPEN REDUCTION INTERNAL FIXATION (ORIF) MANDIBULAR FRACTURE
Anesthesia: General | Site: Mouth

## 2015-09-29 MED ORDER — NEOSTIGMINE METHYLSULFATE 10 MG/10ML IV SOLN
INTRAVENOUS | Status: DC | PRN
  Administered 2015-09-29: 3 mg via INTRAVENOUS

## 2015-09-29 MED ORDER — BACITRACIN ZINC 500 UNIT/GM EX OINT
TOPICAL_OINTMENT | CUTANEOUS | Status: AC
  Filled 2015-09-29: qty 28.35

## 2015-09-29 MED ORDER — LIDOCAINE HCL (CARDIAC) 20 MG/ML IV SOLN
INTRAVENOUS | Status: DC | PRN
  Administered 2015-09-29: 50 mg via INTRATRACHEAL

## 2015-09-29 MED ORDER — SODIUM CHLORIDE 0.9% FLUSH
3.0000 mL | Freq: Two times a day (BID) | INTRAVENOUS | Status: DC
  Administered 2015-09-30: 3 mL via INTRAVENOUS

## 2015-09-29 MED ORDER — FENTANYL CITRATE (PF) 250 MCG/5ML IJ SOLN
INTRAMUSCULAR | Status: AC
  Filled 2015-09-29: qty 5

## 2015-09-29 MED ORDER — HYDROMORPHONE HCL 1 MG/ML IJ SOLN
0.2500 mg | INTRAMUSCULAR | Status: DC | PRN
  Administered 2015-09-29 (×3): 0.5 mg via INTRAVENOUS

## 2015-09-29 MED ORDER — PROPOFOL 10 MG/ML IV BOLUS
INTRAVENOUS | Status: DC | PRN
  Administered 2015-09-29: 200 mg via INTRAVENOUS

## 2015-09-29 MED ORDER — HYDROMORPHONE HCL 1 MG/ML IJ SOLN
INTRAMUSCULAR | Status: AC
  Filled 2015-09-29: qty 1

## 2015-09-29 MED ORDER — MIDAZOLAM HCL 2 MG/2ML IJ SOLN
INTRAMUSCULAR | Status: AC
  Filled 2015-09-29: qty 2

## 2015-09-29 MED ORDER — HYDROCODONE-ACETAMINOPHEN 7.5-325 MG/15ML PO SOLN: 10.0000 mL | ORAL | Status: DC | PRN

## 2015-09-29 MED ORDER — LACTATED RINGERS IV SOLN
INTRAVENOUS | Status: DC | PRN
  Administered 2015-09-29 (×2): via INTRAVENOUS

## 2015-09-29 MED ORDER — AMOXICILLIN 250 MG/5ML PO SUSR: 500.0000 mg | Freq: Three times a day (TID) | ORAL | Status: DC

## 2015-09-29 MED ORDER — SUCCINYLCHOLINE CHLORIDE 20 MG/ML IJ SOLN
INTRAMUSCULAR | Status: DC | PRN
Start: 2015-09-29 — End: 2015-09-29
  Administered 2015-09-29: 100 mg via INTRAVENOUS

## 2015-09-29 MED ORDER — PHENYLEPHRINE HCL 10 MG/ML IJ SOLN
INTRAMUSCULAR | Status: DC | PRN
  Administered 2015-09-29: 40 ug via INTRAVENOUS

## 2015-09-29 MED ORDER — 0.9 % SODIUM CHLORIDE (POUR BTL) OPTIME
TOPICAL | Status: DC | PRN
  Administered 2015-09-29: 1000 mL

## 2015-09-29 MED ORDER — PROPOFOL 10 MG/ML IV BOLUS
INTRAVENOUS | Status: AC
  Filled 2015-09-29: qty 40

## 2015-09-29 MED ORDER — AMOXICILLIN 250 MG/5ML PO SUSR
500.0000 mg | Freq: Three times a day (TID) | ORAL | Status: DC
  Administered 2015-09-30 (×3): 500 mg via ORAL
  Filled 2015-09-29 (×4): qty 10

## 2015-09-29 MED ORDER — ROCURONIUM BROMIDE 100 MG/10ML IV SOLN
INTRAVENOUS | Status: DC | PRN
  Administered 2015-09-29: 20 mg via INTRAVENOUS

## 2015-09-29 MED ORDER — SODIUM CHLORIDE 0.9% FLUSH: 3.0000 mL | INTRAVENOUS | Status: DC | PRN

## 2015-09-29 MED ORDER — MIDAZOLAM HCL 5 MG/5ML IJ SOLN
INTRAMUSCULAR | Status: DC | PRN
  Administered 2015-09-29: 2 mg via INTRAVENOUS

## 2015-09-29 MED ORDER — LIDOCAINE-EPINEPHRINE 1 %-1:100000 IJ SOLN
INTRAMUSCULAR | Status: DC | PRN
  Administered 2015-09-29: 6 mL

## 2015-09-29 MED ORDER — CEFAZOLIN SODIUM-DEXTROSE 2-3 GM-% IV SOLR
INTRAVENOUS | Status: DC | PRN
  Administered 2015-09-29: 2 g via INTRAVENOUS

## 2015-09-29 MED ORDER — FENTANYL CITRATE (PF) 250 MCG/5ML IJ SOLN
INTRAMUSCULAR | Status: DC | PRN
  Administered 2015-09-29: 100 ug via INTRAVENOUS
  Administered 2015-09-29 (×3): 50 ug via INTRAVENOUS

## 2015-09-29 MED ORDER — GLYCOPYRROLATE 0.2 MG/ML IJ SOLN
INTRAMUSCULAR | Status: DC | PRN
  Administered 2015-09-29: 0.4 mg via INTRAVENOUS

## 2015-09-29 MED ORDER — PROMETHAZINE HCL 25 MG/ML IJ SOLN: 6.2500 mg | INTRAMUSCULAR | Status: DC | PRN

## 2015-09-29 MED ORDER — OXYMETAZOLINE HCL 0.05 % NA SOLN
NASAL | Status: DC | PRN
  Administered 2015-09-29: 2 via NASAL

## 2015-09-29 MED ORDER — HYDROCODONE-ACETAMINOPHEN 7.5-325 MG/15ML PO SOLN
10.0000 mL | ORAL | Status: DC | PRN
Start: 2015-09-29 — End: 2015-09-30
  Administered 2015-09-30: 15 mL via ORAL
  Filled 2015-09-29: qty 15

## 2015-09-29 MED ORDER — BACITRACIN ZINC 500 UNIT/GM EX OINT
TOPICAL_OINTMENT | CUTANEOUS | Status: DC | PRN
  Administered 2015-09-29: 1 via TOPICAL

## 2015-09-29 MED ORDER — ONDANSETRON HCL 4 MG/2ML IJ SOLN
INTRAMUSCULAR | Status: DC | PRN
  Administered 2015-09-29: 4 mg via INTRAVENOUS

## 2015-09-29 SURGICAL SUPPLY — 46 items
BLADE SURG 15 STRL LF DISP TIS (BLADE) ×1 IMPLANT
BLADE SURG 15 STRL SS (BLADE) ×2
CANISTER SUCTION 2500CC (MISCELLANEOUS) ×3 IMPLANT
CLEANER TIP ELECTROSURG 2X2 (MISCELLANEOUS) IMPLANT
DRAPE PROXIMA HALF (DRAPES) IMPLANT
ELECT COATED BLADE 2.86 ST (ELECTRODE) ×3 IMPLANT
ELECT NEEDLE TIP 2.8 STRL (NEEDLE) IMPLANT
ELECT REM PT RETURN 9FT ADLT (ELECTROSURGICAL) ×3
ELECTRODE REM PT RTRN 9FT ADLT (ELECTROSURGICAL) ×1 IMPLANT
GLOVE BIOGEL M 7.0 STRL (GLOVE) ×6 IMPLANT
GLOVE SURG SS PI 7.0 STRL IVOR (GLOVE) ×3 IMPLANT
GOWN STRL REUS W/ TWL LRG LVL3 (GOWN DISPOSABLE) ×1 IMPLANT
GOWN STRL REUS W/TWL LRG LVL3 (GOWN DISPOSABLE) ×2
KIT BASIN OR (CUSTOM PROCEDURE TRAY) ×3 IMPLANT
KIT ROOM TURNOVER OR (KITS) ×3 IMPLANT
NEEDLE HYPO 25GX1X1/2 BEV (NEEDLE) ×3 IMPLANT
NS IRRIG 1000ML POUR BTL (IV SOLUTION) ×3 IMPLANT
PAD ARMBOARD 7.5X6 YLW CONV (MISCELLANEOUS) ×6 IMPLANT
PATTIES SURGICAL .5 X3 (DISPOSABLE) IMPLANT
PENCIL BUTTON HOLSTER BLD 10FT (ELECTRODE) ×3 IMPLANT
PLATE 6 H ANGLE 115 D (Plate) ×3 IMPLANT
SCISSORS WIRE DISP (INSTRUMENTS) IMPLANT
SCREW BONE CROSS PIN 2.0X10MM (Screw) ×6 IMPLANT
SCREW BONE CROSS PIN 2.0X12MM (Screw) ×3 IMPLANT
SCREW BONE CROSS PIN 2.0X14 (Screw) ×3 IMPLANT
SCREW MNDBLE 2.0X8 BONE (Screw) ×6 IMPLANT
STAPLER VISISTAT 35W (STAPLE) IMPLANT
SUT BONE WAX W31G (SUTURE) IMPLANT
SUT CHROMIC 3 0 PS 2 (SUTURE) ×3 IMPLANT
SUT CHROMIC 3 0 SH 27 (SUTURE) ×3 IMPLANT
SUT ETHILON 4 0 P 3 18 (SUTURE) IMPLANT
SUT ETHILON 5 0 P 3 18 (SUTURE) ×2
SUT NYLON ETHILON 5-0 P-3 1X18 (SUTURE) ×1 IMPLANT
SUT SILK 3 0 (SUTURE)
SUT SILK 3-0 18XBRD TIE 12 (SUTURE) IMPLANT
SUT STEEL 0 (SUTURE)
SUT STEEL 0 18XMFL TIE 17 (SUTURE) IMPLANT
SUT STEEL 2 (SUTURE) ×3 IMPLANT
SUT VIC AB 3-0 FS2 27 (SUTURE) IMPLANT
SUT VIC AB 4-0 P-3 18X BRD (SUTURE) IMPLANT
SUT VIC AB 4-0 P3 18 (SUTURE)
SUT VIC AB 5-0 P-3 18XBRD (SUTURE) IMPLANT
SUT VIC AB 5-0 P3 18 (SUTURE)
TOWEL OR 17X24 6PK STRL BLUE (TOWEL DISPOSABLE) ×3 IMPLANT
TRAY ENT MC OR (CUSTOM PROCEDURE TRAY) ×3 IMPLANT
WATER STERILE IRR 1000ML POUR (IV SOLUTION) IMPLANT

## 2015-09-29 NOTE — Op Note (Signed)
NAMEONTARIO, WIEBER             ACCOUNT NO.:  [id number]  MEDICAL RECORD NO.:  [id number]  LOCATION:  [facility]                         FACILITY:  [facility]  PHYSICIAN:  Early Chars. Wilburn Cornelia, M.D.DATE OF BIRTH:  [birth year]  DATE OF PROCEDURE:  09/29/2015 DATE OF DISCHARGE:                              OPERATIVE REPORT   PREOPERATIVE DIAGNOSIS:  Left mandibular fracture.  POSTOPERATIVE DIAGNOSIS:  Left mandibular fracture.  INDICATION FOR SURGERY:  Left mandibular fracture.  SURGICAL PROCEDURE:  Open reduction internal fixation of left mandible fracture.  ANESTHESIA:  General nasotracheal.  SURGEON:  Carine Nordgren L. Wilburn Cornelia, MD.  COMPLICATIONS:  No complications.  ESTIMATED BLOOD LOSS:  Less than 100 mL.  DISPOSITION:  The patient was transferred from the operating room to the recovery room in stable condition.  BRIEF HISTORY:  The patient is a 55 year old black male who presented to the [facility] Emergency Department for evaluation of pain in the left jaw.  He had fallen on September 28, 2015 while walking up some steps.  He tripped and struck the left side of his face on a concrete step.  He had a moderate amount of swelling and pain; and the next day, he developed increasing symptoms and difficulty chewing.  The patient was edentulous and had upper and lower dentures which were not broken at the time of his fall.  The patient presented to the emergency department.  CT scan was performed which showed an isolated fracture of the mandible at the mandibular angle on the left-hand side.  No other facial fractures or injuries noted.  The patient was evaluated in the ER; and given his history and findings, I recommended the above surgical procedure.  The risks and benefits of the procedure were discussed in detail.  The patient understood and agreed with our plan for surgery which is scheduled on emergency basis at [place].  DESCRIPTION OF PROCEDURE:  The patient  was brought to the operating room on September 29, 2015, placed in a supine position on the operating table. General nasotracheal anesthesia was established without difficulty.  The patient was adequately anesthetized.  A surgical time-out was performed for correct identification of the patient and the surgical procedure including the left laterality.  The patient was prepped and draped in position.  With the patient prepared for surgery, he was injected with a total of 6 mL of 1% lidocaine with 1:100,000 dilution of epinephrine which injected at the fracture site via transcutaneous injection of the angle of the mandible.  He was also injected intraorally a submucosal fashion the proposed intraoral incision.  After allowing adequate time for vasoconstriction hemostasis, the patient's surgical procedure was begun by creating a mucosal incision with a Bovie electrocautery in the left gingivobuccal sulcus and then along the ascending aspect of the ramus of the mandible.  Soft tissue was divided, and the superolateral aspect of the entire left mandible was exposed using a periosteal elevator.  Soft tissue was dissected free of the mandible exposing the entire left hemimandible.  The patient had a palpable angled fracture which was partially displaced.  The patient was edentulous.  So, occlusion was not a major concern.  The fracture  was reduced, and a Leibinger mandibular fixation set was used.  A six-hole angled mandibular fracture plate was then placed across the fracture measured and bent into proper orientation.  Two separate stab incisions were made in the skin; and using the trocar, the cannula was passed externally to the intraoral incision using 2 mm mandibular fixation screws.  Five screws were placed to fixate the mandible in proper position.  The fracture was stable. There was no mobility around the fracture site, and the patient had good mandibular movement.  The intraoral incision was  then thoroughly irrigated with sterile saline, and the intraoral incision was then closed with interrupted 3-0 chromic sutures.  The external skin incisions were then closed with interrupted 5-0 Ethilon sutures, and a small amount of bacitracin ointment was applied.  The patient was then awakened from his anesthetic.  Prior to extubation, an orogastric tube was passed.  Stomach contents were aspirated.  The patient was then awakened from his anesthetic, extubated and transferred from the operating room to the recovery room in stable condition.  There were no complications.  Estimated blood loss was less than 100 mL.          ______________________________ Early Chars. Wilburn Cornelia, M.D.     DLS/MEDQ  D:  [id number]  T:  09/29/2015  Job:  [id number]

## 2015-09-29 NOTE — ED Provider Notes (Signed)
CSN: [id number]     Arrival date & time 09/29/15  1600 History   First MD Initiated Contact with Patient 09/29/15 1614     Chief Complaint  Patient presents with  . Fall     (Consider location/radiation/quality/duration/timing/severity/associated sxs/prior Treatment) Patient is a 55 y.o. male presenting with fall. The history is provided by the patient.  Fall This is a new problem. The current episode started yesterday. The problem occurs rarely. The problem has been unchanged. Pertinent negatives include no abdominal pain, chest pain, chills, congestion, coughing, fever, nausea, neck pain, rash, sore throat or vomiting. Associated symptoms comments: Left jaw pain. Nothing aggravates the symptoms. The treatment provided no relief.    Past Medical History  Diagnosis Date  . Hep C w/o coma, chronic ([place])    History reviewed. No pertinent past surgical history. Family History  Problem Relation Age of Onset  . Family history unknown: Yes   Social History  Substance Use Topics  . Smoking status: Former Smoker    Types: Cigarettes  . Smokeless tobacco: None  . Alcohol Use: No    Review of Systems  Constitutional: Negative for fever and chills.  HENT: Negative for congestion and sore throat.        Left jaw pain  Eyes: Negative for pain.  Respiratory: Negative for cough and shortness of breath.   Cardiovascular: Negative for chest pain and palpitations.  Gastrointestinal: Negative for nausea, vomiting, abdominal pain and diarrhea.  Endocrine: Negative.   Genitourinary: Negative for flank pain.  Musculoskeletal: Negative for back pain and neck pain.       Right hip pain  Skin: Negative for rash.  Allergic/Immunologic: Negative.   Neurological: Negative for dizziness, syncope and light-headedness.  Psychiatric/Behavioral: Negative for confusion.      Allergies  Ibuprofen  Home Medications   Prior to Admission medications   Medication Sig Start Date End Date Taking?  Authorizing Provider  guaiFENesin (ROBITUSSIN) 100 MG/5ML liquid Take 5-10 mLs (100-200 mg total) by mouth every 4 (four) hours as needed for cough. 03/16/14   Jennifer Piepenbrink, PA-C  meclizine (ANTIVERT) 25 MG tablet Take 0.5 tablets (12.5 mg total) by mouth 3 (three) times daily as needed for dizziness. 05/13/14   Wandra Arthurs, MD  methocarbamol (ROBAXIN) 500 MG tablet Take 1 tablet (500 mg total) by mouth 2 (two) times daily. 2 tablets up to 4 times a day 11/06/12   Elmyra Ricks Pisciotta, PA-C  naproxen (NAPROSYN) 500 MG tablet Take 1 tablet (500 mg total) by mouth 2 (two) times daily. 03/16/14   Jennifer Piepenbrink, PA-C  permethrin (ELIMITE) 5 % cream Apply topically to entire body and leave on overnight. Wash off in the morning and reapply in 14 days 12/14/14   Waldemar Dickens, MD  QUEtiapine (SEROQUEL) 200 MG tablet Take 200 mg by mouth at bedtime.    Historical Provider, MD   BP 106/71 mmHg  Pulse 76  Temp(Src) 98.6 F (37 C) (Oral)  Resp 18  Ht 5\' 9"  (1.753 m)  Wt 68.04 kg  BMI 22.14 kg/m2  SpO2 99% Physical Exam  Constitutional: He is oriented to person, place, and time. He appears well-developed and well-nourished.  HENT:  Head: Normocephalic and atraumatic.  No hyphema, nasal septal hematoma, hemotympanum, battles sign, racoon eyes, or trismus.    TTP over left jaw and anterior right chin.   Eyes: Conjunctivae and EOM are normal. Pupils are equal, round, and reactive to light.  Neck: Normal range of motion. Neck  supple. No spinous process tenderness present.  Cardiovascular: Normal rate, regular rhythm, normal heart sounds and intact distal pulses.   Pulmonary/Chest: Effort normal and breath sounds normal. No respiratory distress.  Abdominal: Soft. Bowel sounds are normal. There is no tenderness.  Musculoskeletal: Normal range of motion.       Right hip: He exhibits tenderness. He exhibits normal range of motion, normal strength and no bony tenderness.  Neurological: He is  alert and oriented to person, place, and time. He has normal strength and normal reflexes. No cranial nerve deficit or sensory deficit. He displays a negative Romberg sign. GCS eye subscore is 4. GCS verbal subscore is 5. GCS motor subscore is 6.  Normal finger to nose bilaterally.   No pronator drift bilaterally.    Skin: Skin is warm and dry.    ED Course  Procedures (including critical care time) Labs Review Labs Reviewed - No data to display  Imaging Review Dg Hip Unilat With Pelvis 2-3 Views Right  09/29/2015  CLINICAL DATA:  Fall onto concrete steps landing on right side yesterday. Right hip pain. EXAM: DG HIP (WITH OR WITHOUT PELVIS) 2-3V RIGHT COMPARISON:  None. FINDINGS: Examination demonstrates minimal symmetric degenerative changes of the hips. Mild degenerate change of the right sacroiliac joint and spine. No acute fracture or dislocation. IMPRESSION: No acute findings. Electronically Signed   By: Marin Olp M.D.   On: 09/29/2015 17:21   Ct Maxillofacial Wo Cm  09/29/2015  CLINICAL DATA:  Left-sided facial trauma secondary to a fall on stairs yesterday. Bruising to the left cheek and pain in the left side of the mandible. EXAM: CT MAXILLOFACIAL WITHOUT CONTRAST TECHNIQUE: Multidetector CT imaging of the maxillofacial structures was performed. Multiplanar CT image reconstructions were also generated. A small metallic BB was placed on the right temple in order to reliably differentiate right from left. COMPARISON:  CT scan of the head dated 05/13/2014 FINDINGS: There is an oblique slightly distracted fracture through the angle of the left side of the mandible. No other fractures. The patient is edentulous. Orbits appear normal. Minimal mucosal thickening in the left maxillary sinus. Degenerative disc disease at C5-6. Moderately severe right facet arthritis at C2-3. Slight nasal septal deviation from right to left which is chronic. IMPRESSION: Slightly distracted oblique fracture through  the angle of the left side of the mandible. Electronically Signed   By: Lorriane Shire M.D.   On: 09/29/2015 17:35   I have personally reviewed and evaluated these images and lab results as part of my medical decision-making.   EKG Interpretation None      MDM   Final diagnoses:  Hip pain    The pt is a 55 yo male presenting after fall yesterday.  Reports mechanical fall striking the left side of jaw with no LOC.  Also reports right hip pain but no difficulty with mobility since the event.    On exam pt is HDS in NAD.  No HA or neurologic complaints with normal neurologic exam and do not feel head CT warranted at this time.  TTP over jaw and CT face ordered.  CT displayed slightly distracted fx of mandible.  XR hip negative and RLE neurovascularly intact.   ENT consulted and pt taken to OR prior to consultant labs being returned in the ED.   Labs were viewed by myself and incorporated into medical decision making.  Discussed pertinent finding with patient or caregiver prior to admission with no further questions.  Pt care supervised  by my attending Dr. Janelle Floor, MD PGY-3 Emergency Medicine      Geronimo Boot, MD 09/29/15 [id number]  Varney Biles, MD 09/29/15 [id number]

## 2015-09-29 NOTE — H&P (Signed)
Richard Bauer is an 55 y.o. male.   Chief Complaint: Left mandible fracture HPI: Pt fell last pm and struck left jaw  Past Medical History  Diagnosis Date  . Hep C w/o coma, chronic ([place])     History reviewed. No pertinent past surgical history.  Family History  Problem Relation Age of Onset  . Family history unknown: Yes   Social History:  reports that he has quit smoking. His smoking use included Cigarettes. He does not have any smokeless tobacco history on file. He reports that he does not drink alcohol or use illicit drugs.  Allergies:  Allergies  Allergen Reactions  . Ibuprofen Nausea And Vomiting and Swelling     (Not in a hospital admission)  Results for orders placed or performed during the hospital encounter of 09/29/15 (from the past 48 hour(s))  Basic metabolic panel     Status: Abnormal   Collection Time: 09/29/15  6:07 PM  Result Value Ref Range   Sodium 140 135 - 145 mmol/L   Potassium 3.8 3.5 - 5.1 mmol/L   Chloride 107 101 - 111 mmol/L   CO2 27 22 - 32 mmol/L   Glucose, Bld 122 (H) 65 - 99 mg/dL   BUN 23 (H) 6 - 20 mg/dL   Creatinine, Ser 1.03 0.61 - 1.24 mg/dL   Calcium 9.8 8.9 - 10.3 mg/dL   GFR calc non Af Amer >60 >60 mL/min   GFR calc Af Amer >60 >60 mL/min    Comment: (NOTE) The eGFR has been calculated using the CKD EPI equation. This calculation has not been validated in all clinical situations. eGFR's persistently <60 mL/min signify possible Chronic Kidney Disease.    Anion gap 6 5 - 15  CBC     Status: Abnormal   Collection Time: 09/29/15  6:07 PM  Result Value Ref Range   WBC 3.4 (L) 4.0 - 10.5 K/uL   RBC 3.90 (L) 4.22 - 5.81 MIL/uL   Hemoglobin 12.7 (L) 13.0 - 17.0 g/dL   HCT 38.3 (L) 39.0 - 52.0 %   MCV 98.2 78.0 - 100.0 fL   MCH 32.6 26.0 - 34.0 pg   MCHC 33.2 30.0 - 36.0 g/dL   RDW 13.5 11.5 - 15.5 %   Platelets 235 150 - 400 K/uL   Dg Hip Unilat With Pelvis 2-3 Views Right  09/29/2015  CLINICAL DATA:  Fall onto concrete  steps landing on right side yesterday. Right hip pain. EXAM: DG HIP (WITH OR WITHOUT PELVIS) 2-3V RIGHT COMPARISON:  None. FINDINGS: Examination demonstrates minimal symmetric degenerative changes of the hips. Mild degenerate change of the right sacroiliac joint and spine. No acute fracture or dislocation. IMPRESSION: No acute findings. Electronically Signed   By: Marin Olp M.D.   On: 09/29/2015 17:21   Ct Maxillofacial Wo Cm  09/29/2015  CLINICAL DATA:  Left-sided facial trauma secondary to a fall on stairs yesterday. Bruising to the left cheek and pain in the left side of the mandible. EXAM: CT MAXILLOFACIAL WITHOUT CONTRAST TECHNIQUE: Multidetector CT imaging of the maxillofacial structures was performed. Multiplanar CT image reconstructions were also generated. A small metallic BB was placed on the right temple in order to reliably differentiate right from left. COMPARISON:  CT scan of the head dated 05/13/2014 FINDINGS: There is an oblique slightly distracted fracture through the angle of the left side of the mandible. No other fractures. The patient is edentulous. Orbits appear normal. Minimal mucosal thickening in the left maxillary sinus. Degenerative  disc disease at C5-6. Moderately severe right facet arthritis at C2-3. Slight nasal septal deviation from right to left which is chronic. IMPRESSION: Slightly distracted oblique fracture through the angle of the left side of the mandible. Electronically Signed   By: Lorriane Shire M.D.   On: 09/29/2015 17:35    Review of Systems  Constitutional: Negative.   HENT: Negative.   Respiratory: Negative.   Cardiovascular: Negative.   Gastrointestinal: Negative.     Blood pressure 110/70, pulse 72, temperature 98.6 F (37 C), temperature source Oral, resp. rate 18, height _0  (1.753 m), weight 68.04 kg (150 lb), SpO2 96 %. Physical Exam  Constitutional: He appears well-developed and well-nourished.  HENT:  Tender, min swelling Left mandible  angle  Neck: Normal range of motion. Neck supple.  Cardiovascular: Normal rate.   Respiratory: Effort normal.  GI: Soft.     Assessment/Plan Adm for OP ORIF left mandible fracture  Shanterica Biehler, MD 09/29/2015, 7:06 PM

## 2015-09-29 NOTE — ED Notes (Signed)
Patient fell yesterday striking left side of face across jaw line and also complains of right hip pain with ambulation. Denies loc. Having trouble opening his mouth since the fall

## 2015-09-29 NOTE — Anesthesia Preprocedure Evaluation (Addendum)
Anesthesia Evaluation  Patient identified by MRN, date of birth, ID band Patient awake    Reviewed: Allergy & Precautions, NPO status , Patient's Chart, lab work & pertinent test results  History of Anesthesia Complications Negative for: history of anesthetic complications  Airway Mallampati: I  TM Distance: >3 FB Neck ROM: Full    Dental  (+) Edentulous Upper, Edentulous Lower, Dental Advisory Given   Pulmonary Current Smoker, former smoker,    Pulmonary exam normal        Cardiovascular Normal cardiovascular exam     Neuro/Psych Depression Bipolar Disorder negative neurological ROS     GI/Hepatic (+) Hepatitis -, CLFTs mildly elavated in past. No bleeding problems   Endo/Other  negative endocrine ROS  Renal/GU negative Renal ROS     Musculoskeletal   Abdominal   Peds  Hematology negative hematology ROS (+)   Anesthesia Other Findings   Reproductive/Obstetrics                         Anesthesia Physical Anesthesia Plan  ASA: III and emergent  Anesthesia Plan: General   Post-op Pain Management:    Induction: Intravenous  Airway Management Planned: Nasal ETT  Additional Equipment:   Intra-op Plan:   Post-operative Plan: Extubation in OR  Informed Consent: I have reviewed the patients History and Physical, chart, labs and discussed the procedure including the risks, benefits and alternatives for the proposed anesthesia with the patient or authorized representative who has indicated his/her understanding and acceptance.   Dental advisory given  Plan Discussed with: CRNA, Anesthesiologist and Surgeon  Anesthesia Plan Comments:        Anesthesia Quick Evaluation

## 2015-09-29 NOTE — Brief Op Note (Signed)
09/29/2015  9:25 PM  PATIENT:  Richard Bauer  55 y.o. male  PRE-OPERATIVE DIAGNOSIS:  mandible fracture  POST-OPERATIVE DIAGNOSIS:  mandible fracture  PROCEDURE:  Procedure(s): OPEN REDUCTION INTERNAL FIXATION (ORIF) MANDIBULAR FRACTURE (N/A)  SURGEON:  Surgeon(s) and Role:    * Jerrell Belfast, MD - Primary  PHYSICIAN ASSISTANT:   ASSISTANTS: none   ANESTHESIA:   general  EBL:  Total I/O In: 1000 [I.V.:1000] Out: 15 [Blood:15]100cc  BLOOD ADMINISTERED:none  DRAINS: none   LOCAL MEDICATIONS USED:  LIDOCAINE  and Amount: 6 ml  SPECIMEN:  No Specimen  DISPOSITION OF SPECIMEN:  N/A  COUNTS:  YES  TOURNIQUET:  * No tourniquets in log *  DICTATION: .Other Dictation: Dictation Number [id number]  PLAN OF CARE: Discharge to home after PACU  PATIENT DISPOSITION:  PACU - hemodynamically stable.   Delay start of Pharmacological VTE agent (>24hrs) due to surgical blood loss or risk of bleeding: not applicable

## 2015-09-29 NOTE — Progress Notes (Signed)
D; unable to get in touch with family, pt said his sister  Doesn't have car,   Notified MD, observation tonight order received

## 2015-09-29 NOTE — Anesthesia Postprocedure Evaluation (Signed)
Anesthesia Post Note  Patient: Richard Bauer  Procedure(s) Performed: Procedure(s) (LRB): OPEN REDUCTION INTERNAL FIXATION (ORIF) MANDIBULAR FRACTURE (N/A)  Patient location during evaluation: PACU Anesthesia Type: General Level of consciousness: sedated Pain management: pain level controlled Vital Signs Assessment: post-procedure vital signs reviewed and stable Respiratory status: spontaneous breathing and respiratory function stable Cardiovascular status: stable Anesthetic complications: no    Last Vitals:  Filed Vitals:   09/29/15 1815 09/29/15 2130  BP: 110/70 159/96  Pulse: 72 71  Temp:    Resp:  11    Last Pain:  Filed Vitals:   09/29/15 2146  PainSc: [place]

## 2015-09-29 NOTE — Anesthesia Procedure Notes (Signed)
Procedure Name: Intubation Date/Time: 09/29/2015 7:40 PM Performed by: Maude Leriche D Pre-anesthesia Checklist: Patient identified, Emergency Drugs available, Suction available, Patient being monitored and Timeout performed Patient Re-evaluated:Patient Re-evaluated prior to inductionOxygen Delivery Method: Circle system utilized Preoxygenation: Pre-oxygenation with 100% oxygen Intubation Type: IV induction, Rapid sequence and Cricoid Pressure applied Laryngoscope Size: Mac Grade View: Grade I Nasal Tubes: Right, Magill forceps- large, utilized, Nasal prep performed and Nasal Rae Tube size: 7.5 mm Number of attempts: 1 Placement Confirmation: ETT inserted through vocal cords under direct vision,  positive ETCO2 and breath sounds checked- equal and bilateral Secured at: 27 cm Tube secured with: Tape Dental Injury: Teeth and Oropharynx as per pre-operative assessment

## 2015-09-29 NOTE — Transfer of Care (Signed)
Immediate Anesthesia Transfer of Care Note  Patient: Richard Bauer  Procedure(s) Performed: Procedure(s): OPEN REDUCTION INTERNAL FIXATION (ORIF) MANDIBULAR FRACTURE (N/A)  Patient Location: PACU  Anesthesia Type:General  Level of Consciousness: sedated  Airway & Oxygen Therapy: Patient Spontanous Breathing and Patient connected to face mask oxygen  Post-op Assessment: Report given to RN and Post -op Vital signs reviewed and stable  Post vital signs: Reviewed and stable  Last Vitals:  Filed Vitals:   09/29/15 1815 09/29/15 2130  BP: 110/70 159/96  Pulse: 72 71  Temp:    Resp:  11    Last Pain:  Filed Vitals:   09/29/15 2132  PainSc: 9          Complications: No apparent anesthesia complications

## 2015-09-30 ENCOUNTER — Encounter (HOSPITAL_COMMUNITY): Payer: Self-pay | Admitting: Otolaryngology

## 2015-09-30 LAB — POCT I-STAT 4, (NA,K, GLUC, HGB,HCT)
GLUCOSE: 108 mg/dL — AB (ref 65–99)
HEMATOCRIT: 38 % — AB (ref 39.0–52.0)
HEMOGLOBIN: 12.9 g/dL — AB (ref 13.0–17.0)
Potassium: 3.9 mmol/L (ref 3.5–5.1)
SODIUM: 145 mmol/L (ref 135–145)

## 2015-09-30 MED ORDER — HYDROCODONE-ACETAMINOPHEN 7.5-325 MG/15ML PO SOLN: 10.0000 mL | ORAL | Status: DC | PRN

## 2015-09-30 MED ORDER — AMOXICILLIN 250 MG/5ML PO SUSR: 500.0000 mg | Freq: Three times a day (TID) | ORAL | Status: DC

## 2015-09-30 NOTE — Progress Notes (Signed)
   ENT Progress Note: POD #1 s/p Procedure(s): OPEN REDUCTION INTERNAL FIXATION (ORIF) MANDIBULAR FRACTURE   Subjective: Mod pain and swelling  Objective: Vital signs in last 24 hours: Temp:  [98 F (36.7 C)-101 F (38.3 C)] 99.3 F (37.4 C) (07/03 0600) Pulse Rate:  [52-91] 69 (07/03 0433) Resp:  [11-18] 18 (07/03 0433) BP: (106-159)/(70-131) 147/82 mmHg (07/03 0433) SpO2:  [95 %-100 %] 98 % (07/03 0433) Weight:  [68.04 kg (150 lb)] 68.04 kg (150 lb) (07/02 1612) Weight change:     Intake/Output from previous day: 07/02 0701 - 07/03 0700 In: 1200 [P.O.:200; I.V.:1000] Out: 415 [Urine:400; Blood:15] Intake/Output this shift: Total I/O In: -  Out: 350 [Urine:350]  Labs:  Recent Labs  09/29/15 1807  WBC 3.4*  HGB 12.7*  HCT 38.3*  PLT 235    Recent Labs  09/29/15 1807  NA 140  K 3.8  CL 107  CO2 27  GLUCOSE 122*  BUN 23*  CALCIUM 9.8    Studies/Results: Dg Hip Unilat With Pelvis 2-3 Views Right  09/29/2015  CLINICAL DATA:  Fall onto concrete steps landing on right side yesterday. Right hip pain. EXAM: DG HIP (WITH OR WITHOUT PELVIS) 2-3V RIGHT COMPARISON:  None. FINDINGS: Examination demonstrates minimal symmetric degenerative changes of the hips. Mild degenerate change of the right sacroiliac joint and spine. No acute fracture or dislocation. IMPRESSION: No acute findings. Electronically Signed   By: Marin Olp M.D.   On: 09/29/2015 17:21   Ct Maxillofacial Wo Cm  09/29/2015  CLINICAL DATA:  Left-sided facial trauma secondary to a fall on stairs yesterday. Bruising to the left cheek and pain in the left side of the mandible. EXAM: CT MAXILLOFACIAL WITHOUT CONTRAST TECHNIQUE: Multidetector CT imaging of the maxillofacial structures was performed. Multiplanar CT image reconstructions were also generated. A small metallic BB was placed on the right temple in order to reliably differentiate right from left. COMPARISON:  CT scan of the head dated 05/13/2014  FINDINGS: There is an oblique slightly distracted fracture through the angle of the left side of the mandible. No other fractures. The patient is edentulous. Orbits appear normal. Minimal mucosal thickening in the left maxillary sinus. Degenerative disc disease at C5-6. Moderately severe right facet arthritis at C2-3. Slight nasal septal deviation from right to left which is chronic. IMPRESSION: Slightly distracted oblique fracture through the angle of the left side of the mandible. Electronically Signed   By: Lorriane Shire M.D.   On: 09/29/2015 17:35     PHYSICAL EXAM: Mod swelling Inc intact   Assessment/Plan: Pt stable o/n Liquid and soft diet only D/C to home    Harold, Catilyn Boggus 09/30/2015, 7:49 AM

## 2015-09-30 NOTE — Discharge Summary (Signed)
Physician Discharge Summary  Patient ID: Richard Bauer MRN: [id number] DOB/AGE: [birth year] 55 y.o.  Admit date: 09/29/2015 Discharge date: 09/30/2015  Admission Diagnoses:  Active Problems:   Fracture of mandible of oth site, init for opn fx   Discharge Diagnoses:  Same  Surgeries: Procedure(s): OPEN REDUCTION INTERNAL FIXATION (ORIF) MANDIBULAR FRACTURE on 09/29/2015   Consultants: none  Discharged Condition: Improved  Hospital Course: Richard Bauer is an 55 y.o. male who was admitted 09/29/2015 with a diagnosis of mandible fracture and went to the operating room on 09/29/2015 and underwent the above named procedures.   Pt d/c'ed on 7/3.  Recent vital signs:  Filed Vitals:   09/30/15 0433 09/30/15 0600  BP: 147/82   Pulse: 69   Temp: 101 F (38.3 C) 99.3 F (37.4 C)  Resp: 18     Recent laboratory studies:  Results for orders placed or performed during the hospital encounter of [id number]  Basic metabolic panel  Result Value Ref Range   Sodium 140 135 - 145 mmol/L   Potassium 3.8 3.5 - 5.1 mmol/L   Chloride 107 101 - 111 mmol/L   CO2 27 22 - 32 mmol/L   Glucose, Bld 122 (H) 65 - 99 mg/dL   BUN 23 (H) 6 - 20 mg/dL   Creatinine, Ser 1.03 0.61 - 1.24 mg/dL   Calcium 9.8 8.9 - 10.3 mg/dL   GFR calc non Af Amer >60 >60 mL/min   GFR calc Af Amer >60 >60 mL/min   Anion gap 6 5 - 15  CBC  Result Value Ref Range   WBC 3.4 (L) 4.0 - 10.5 K/uL   RBC 3.90 (L) 4.22 - 5.81 MIL/uL   Hemoglobin 12.7 (L) 13.0 - 17.0 g/dL   HCT 38.3 (L) 39.0 - 52.0 %   MCV 98.2 78.0 - 100.0 fL   MCH 32.6 26.0 - 34.0 pg   MCHC 33.2 30.0 - 36.0 g/dL   RDW 13.5 11.5 - 15.5 %   Platelets 235 150 - 400 K/uL    Discharge Medications:     Medication List    TAKE these medications        amoxicillin 250 MG/5ML suspension  Commonly known as:  AMOXIL  Take 10 mLs (500 mg total) by mouth 3 (three) times daily.     amoxicillin 250 MG/5ML suspension  Commonly known as:  AMOXIL  Take 10 mLs  (500 mg total) by mouth every 8 (eight) hours.     guaiFENesin 100 MG/5ML liquid  Commonly known as:  ROBITUSSIN  Take 5-10 mLs (100-200 mg total) by mouth every 4 (four) hours as needed for cough.     HYDROcodone-acetaminophen 7.5-325 mg/15 ml solution  Commonly known as:  HYCET  Take 10-15 mLs by mouth every 4 (four) hours as needed for moderate pain.     HYDROcodone-acetaminophen 7.5-325 mg/15 ml solution  Commonly known as:  HYCET  Take 10-15 mLs by mouth every 4 (four) hours as needed for moderate pain.     meclizine 25 MG tablet  Commonly known as:  ANTIVERT  Take 0.5 tablets (12.5 mg total) by mouth 3 (three) times daily as needed for dizziness.     methocarbamol 500 MG tablet  Commonly known as:  ROBAXIN  Take 1 tablet (500 mg total) by mouth 2 (two) times daily. 2 tablets up to 4 times a day     naproxen 500 MG tablet  Commonly known as:  NAPROSYN  Take 1 tablet (500 mg total) by  mouth 2 (two) times daily.     QUEtiapine 200 MG 24 hr tablet  Commonly known as:  SEROQUEL XR  Take 200 mg by mouth at bedtime.     TRINTELLIX 20 MG Tabs  Generic drug:  vortioxetine HBr  Take 20 mg by mouth daily.        Diagnostic Studies: Dg Hip Unilat With Pelvis 2-3 Views Right  09/29/2015  CLINICAL DATA:  Fall onto concrete steps landing on right side yesterday. Right hip pain. EXAM: DG HIP (WITH OR WITHOUT PELVIS) 2-3V RIGHT COMPARISON:  None. FINDINGS: Examination demonstrates minimal symmetric degenerative changes of the hips. Mild degenerate change of the right sacroiliac joint and spine. No acute fracture or dislocation. IMPRESSION: No acute findings. Electronically Signed   By: Marin Olp M.D.   On: 09/29/2015 17:21   Ct Maxillofacial Wo Cm  09/29/2015  CLINICAL DATA:  Left-sided facial trauma secondary to a fall on stairs yesterday. Bruising to the left cheek and pain in the left side of the mandible. EXAM: CT MAXILLOFACIAL WITHOUT CONTRAST TECHNIQUE: Multidetector CT imaging  of the maxillofacial structures was performed. Multiplanar CT image reconstructions were also generated. A small metallic BB was placed on the right temple in order to reliably differentiate right from left. COMPARISON:  CT scan of the head dated 05/13/2014 FINDINGS: There is an oblique slightly distracted fracture through the angle of the left side of the mandible. No other fractures. The patient is edentulous. Orbits appear normal. Minimal mucosal thickening in the left maxillary sinus. Degenerative disc disease at C5-6. Moderately severe right facet arthritis at C2-3. Slight nasal septal deviation from right to left which is chronic. IMPRESSION: Slightly distracted oblique fracture through the angle of the left side of the mandible. Electronically Signed   By: Lorriane Shire M.D.   On: 09/29/2015 17:35    Disposition: 02-[facility]red to [facility]      Discharge Instructions    Diet - low sodium heart healthy    Complete by:  As directed      Diet - low sodium heart healthy    Complete by:  As directed      Discharge instructions    Complete by:  As directed   Jaw Fracture Instructions: 1. Limited activity 2. Liquid and soft diet only 3. May bathe and shower 4. Do not wear dentures until f/u 5. Elevate Head of Bed 6. Ice compress to jaw 7. Rinse mouth with water twice daily 8. Keep wire cutters on hand at [place], only cut wires if vomiting   Fractured Jaw Diet This diet should be used after jaw or mouth surgery, wired jaw surgery, or dental surgery. The consistency of foods in this diet should be thin enough to be sipped from a straw or given through a syringe. It is important to consume enough calories and protein to prevent weight loss and to promote healing after surgery. You will need to have 3 meals and 3 snacks daily. It is important to eat from a variety of food groups. Foods you normally eat may be blended to the correct texture. INSTRUCTIONS FOR BLENDING  FOODS Prepare foods by removing skins, seeds, and peels.  Cook meats and vegetables thoroughly. Avoid using raw eggs. Powdered or pasteurized egg mixtures may be used.  Cut foods into small pieces and mix with a small amount of liquid in a food processor or blender. Continue to add liquids until foods become thin enough to sip through a straw.  Add juice, milk, cream, broth, gravy, or vegetable juice to add flavor and to thin foods.  Blending foods sometimes causes air bubbles that may not be desirable. Heating foods after blending will reduce the foam produced from blending.  TIPS If you are losing weight, you may need to add extra calories to your food by:  Adding powdered milk or protein powder to food.  Adding extra fats, such as tub margarine, sour cream, cream cheese, cream, nut butters (such as peanut butter or almond butter), and half-and-half.  Adding sweets, such as honey, ice cream, black strap molasses, or sugar.  Your teeth and mouth may be sensitive to extreme temperatures. Heat or cool your foods only to lukewarm or cool temperatures.  Stock Conservator, museum/gallery with a variety of liquid nutritional supplement drinks.  Take a liquid multivitamin to ensure you are getting adequate nutrients.  Use baby food if you are short on time or energy.  FOOD CHOICES ALL FOODS MUST BE BLENDED. Starches 4 servings or more Hot cereals, such as oatmeal, grits, ground wheat cereals, and polenta.  Mashed potatoes.  Vegetables 3 servings or more All vegetables and vegetable juices.  Fruit 2 servings or more All fruits and fruit juices.  Meat and Meat Substitutes 2 servings or more (6 oz per day) Soft-boiled eggs, scrambled eggs, cottage cheese, cheese sauce.  Ground meats, such as hamburger, [place], sausage, meatloaf.  Custard, baby food meat.  Milk 2 cups or equivalent Liquid, powdered, or evaporated milk.  Buttermilk or chocolate milk.  Drinkable yogurt.  [place], ice cream, milkshakes,  eggnog, pudding, and liquid supplements.  Beverages Coffee (regular or decaffeinated), tea, and mineral water.  Liquid supplements.  Condiments All seasonings and condiments that blend well.  Document Released: 09/03/2009 Document Revised: 11/26/2010 Document Reviewed: 09/03/2009 [facility] Patient Information 2012 [place].     Increase activity slowly    Complete by:  As directed      Increase activity slowly    Complete by:  As directed            Follow-up Information    Follow up with Mionna Advincula, MD In 2 weeks.   Specialty:  Otolaryngology   Contact information:   [address] [place] [address] [phone]        Signed: Jerrell Belfast 09/30/2015, 7:55 AM

## 2015-09-30 NOTE — Care Management Note (Addendum)
Case Management Note  Patient Details  Name: Richard Bauer MRN: [id number] Date of Birth: 01-15-1961  Subjective/Objective:   Pt admitted on 09/29/15 s/p Lt mandible fx.  PTA, pt independent of ADLS.                  Action/Plan: Pt for dc home today.  CSW consulted for transportation issues.  Pt is uninsured, but is eligible for medication assistance through [employer] letter given with explanation of program benefits.    Expected Discharge Date:    09/30/2015              Expected Discharge Plan:  Home/Self Care  In-House Referral:  Clinical Social Work  Discharge planning Services  CM Consult, [facility] program  Post Acute Care Choice:    Choice offered to:     DME Arranged:    DME Agency:     HH Arranged:    [place] Agency:     Status of Service:  Completed, signed off  If discussed at [employer], dates discussed:    Additional Comments:  Reinaldo Raddle, RN, BSN  Trauma/Neuro ICU Case Manager [phone]

## 2015-09-30 NOTE — Progress Notes (Signed)
Pt discharged to home.  Discharge instructions explained to pt.  Pt has no questions at the time of discharge.  Pt states he has all belongings.  IV removed.  Pt ambulated off unit on his own.   

## 2015-11-17 ENCOUNTER — Emergency Department (HOSPITAL_COMMUNITY)
Admission: EM | Admit: 2015-11-17 | Discharge: 2015-11-17 | Disposition: A | Discharge status: Home / Self Care | Payer: Self-pay | Attending: Emergency Medicine | Admitting: Emergency Medicine

## 2015-11-17 ENCOUNTER — Emergency Department (HOSPITAL_COMMUNITY): Payer: Self-pay

## 2015-11-17 ENCOUNTER — Encounter (HOSPITAL_COMMUNITY): Payer: Self-pay

## 2015-11-17 DIAGNOSIS — M5431 Sciatica, right side: Secondary | ICD-10-CM | POA: Insufficient documentation

## 2015-11-17 DIAGNOSIS — Y92410 Unspecified street and highway as the place of occurrence of the external cause: Secondary | ICD-10-CM | POA: Insufficient documentation

## 2015-11-17 DIAGNOSIS — Y999 Unspecified external cause status: Secondary | ICD-10-CM | POA: Insufficient documentation

## 2015-11-17 DIAGNOSIS — S80211A Abrasion, right knee, initial encounter: Secondary | ICD-10-CM | POA: Insufficient documentation

## 2015-11-17 DIAGNOSIS — W052XXA Fall from non-moving motorized mobility scooter, initial encounter: Secondary | ICD-10-CM | POA: Insufficient documentation

## 2015-11-17 DIAGNOSIS — S8991XA Unspecified injury of right lower leg, initial encounter: Secondary | ICD-10-CM

## 2015-11-17 DIAGNOSIS — Y939 Activity, unspecified: Secondary | ICD-10-CM | POA: Insufficient documentation

## 2015-11-17 DIAGNOSIS — L03115 Cellulitis of right lower limb: Secondary | ICD-10-CM | POA: Insufficient documentation

## 2015-11-17 DIAGNOSIS — Z87891 Personal history of nicotine dependence: Secondary | ICD-10-CM | POA: Insufficient documentation

## 2015-11-17 MED ORDER — BACITRACIN ZINC 500 UNIT/GM EX OINT: 1.0000 "application " | TOPICAL_OINTMENT | Freq: Two times a day (BID) | CUTANEOUS | 0 refills | Status: DC

## 2015-11-17 MED ORDER — DEXAMETHASONE SODIUM PHOSPHATE 10 MG/ML IJ SOLN
10.0000 mg | Freq: Once | INTRAMUSCULAR | Status: AC
  Administered 2015-11-17: 10 mg via INTRAMUSCULAR
  Filled 2015-11-17: qty 1

## 2015-11-17 MED ORDER — OXYCODONE-ACETAMINOPHEN 5-325 MG PO TABS: 1.0000 | ORAL_TABLET | ORAL | 0 refills | Status: DC | PRN

## 2015-11-17 MED ORDER — KETOROLAC TROMETHAMINE 60 MG/2ML IM SOLN
60.0000 mg | Freq: Once | INTRAMUSCULAR | Status: AC
  Administered 2015-11-17: 60 mg via INTRAMUSCULAR
  Filled 2015-11-17: qty 2

## 2015-11-17 MED ORDER — CEPHALEXIN 250 MG PO CAPS
1000.0000 mg | ORAL_CAPSULE | Freq: Once | ORAL | Status: AC
  Administered 2015-11-17: 1000 mg via ORAL
  Filled 2015-11-17: qty 4

## 2015-11-17 MED ORDER — PREDNISONE 20 MG PO TABS: 40.0000 mg | ORAL_TABLET | Freq: Every day | ORAL | 0 refills | Status: DC

## 2015-11-17 MED ORDER — CEPHALEXIN 500 MG PO CAPS: 500.0000 mg | ORAL_CAPSULE | Freq: Four times a day (QID) | ORAL | 0 refills | Status: DC

## 2015-11-17 MED ORDER — CYCLOBENZAPRINE HCL 10 MG PO TABS: 10.0000 mg | ORAL_TABLET | Freq: Two times a day (BID) | ORAL | 0 refills | Status: DC | PRN

## 2015-11-17 MED ORDER — BACITRACIN ZINC 500 UNIT/GM EX OINT
1.0000 "application " | TOPICAL_OINTMENT | Freq: Two times a day (BID) | CUTANEOUS | Status: DC
  Administered 2015-11-17: 1 via TOPICAL
  Filled 2015-11-17: qty 0.9

## 2015-11-17 NOTE — ED Provider Notes (Signed)
[facility] DEPT Provider Note   CSN: [id number] Arrival date & time: 11/17/15  [id number]  By signing my name below, I, Richard Bauer, attest that this documentation has been prepared under the direction and in the presence of  Delsa Grana, PA-C. Electronically Signed: Royce Bauer, ED Scribe. 11/17/15. 11:37 AM.   History   Chief Complaint Chief Complaint  Patient presents with  . Back Pain   The history is provided by the patient. No language interpreter was used.     HPI Comments:  Richard Bauer is a 55 y.o. male who presents to the Emergency Department complaining of constant, 9/10, shooting and stabbing, right lower back pain that radiates down his right thigh and leg which began two days ago after strenuous work lifting shingles. Pain is exacerbated by any movement or palpation.  No alleviating factors, no treatments PTA.  He can walk but states it is painful.  He did have a mechanical fall that same day and landed on his right knee, sustained an abrasion and has some swelling, redness and mild pain.  He denies head injury or LOC during fall, no other injuries.  He denies numbness, tingling, weakness,flank pain, abdominal pain,fever, N, V, D, dysuria, hematuria, and bowel/bladder incontinence.  No h/o cancer, steroid use, and IV drug use.  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   Past Medical History:  Diagnosis Date  . Hep C w/o coma, chronic [facility])     Patient Active Problem List   Diagnosis Date Noted  . Fracture of mandible of oth site, init for opn fx 09/29/2015    Past Surgical History:  Procedure Laterality Date  . ORIF MANDIBULAR FRACTURE N/A 09/29/2015   Procedure: OPEN REDUCTION INTERNAL FIXATION (ORIF) MANDIBULAR FRACTURE;  Surgeon: Jerrell Belfast, MD;  Location: [facility];  Service: ENT;  Laterality: N/A;       Home Medications    Prior to Admission medications   Medication Sig Start Date End Date Taking? Authorizing Provider  amoxicillin (AMOXIL) 250 MG/5ML  suspension Take 10 mLs (500 mg total) by mouth 3 (three) times daily. 09/29/15   Jerrell Belfast, MD  amoxicillin (AMOXIL) 250 MG/5ML suspension Take 10 mLs (500 mg total) by mouth every 8 (eight) hours. 09/30/15   Jerrell Belfast, MD  bacitracin ointment Apply 1 application topically 2 (two) times daily. 11/17/15   Delsa Grana, PA-C  cephALEXin (KEFLEX) 500 MG capsule Take 1 capsule (500 mg total) by mouth 4 (four) times daily. 11/17/15   Delsa Grana, PA-C  cyclobenzaprine (FLEXERIL) 10 MG tablet Take 1 tablet (10 mg total) by mouth 2 (two) times daily as needed for muscle spasms. 11/17/15   Delsa Grana, PA-C  guaiFENesin (ROBITUSSIN) 100 MG/5ML liquid Take 5-10 mLs (100-200 mg total) by mouth every 4 (four) hours as needed for cough. Patient not taking: Reported on 09/29/2015 03/16/14   Baron Sane, PA-C  HYDROcodone-acetaminophen (HYCET) 7.5-325 mg/15 ml solution Take 10-15 mLs by mouth every 4 (four) hours as needed for moderate pain. 09/29/15   Jerrell Belfast, MD  HYDROcodone-acetaminophen (HYCET) 7.5-325 mg/15 ml solution Take 10-15 mLs by mouth every 4 (four) hours as needed for moderate pain. 09/30/15   Jerrell Belfast, MD  meclizine (ANTIVERT) 25 MG tablet Take 0.5 tablets (12.5 mg total) by mouth 3 (three) times daily as needed for dizziness. Patient not taking: Reported on 09/29/2015 05/13/14   Drenda Freeze, MD  methocarbamol (ROBAXIN) 500 MG tablet Take 1 tablet (500 mg total) by mouth 2 (two) times daily. 2 tablets  up to 4 times a day Patient not taking: Reported on 09/29/2015 11/06/12   Elmyra Ricks Pisciotta, PA-C  naproxen (NAPROSYN) 500 MG tablet Take 1 tablet (500 mg total) by mouth 2 (two) times daily. Patient not taking: Reported on 09/29/2015 03/16/14   Baron Sane, PA-C  oxyCODONE-acetaminophen (PERCOCET) 5-325 MG tablet Take 1 tablet by mouth every 4 (four) hours as needed. 11/17/15   Delsa Grana, PA-C  predniSONE (DELTASONE) 20 MG tablet Take 2 tablets (40 mg total) by mouth  daily. 11/17/15   Delsa Grana, PA-C  QUEtiapine (SEROQUEL XR) 200 MG 24 hr tablet Take 200 mg by mouth at bedtime.    Historical Provider, MD  vortioxetine HBr (TRINTELLIX) 20 MG TABS Take 20 mg by mouth daily.    Historical Provider, MD    Family History Family History  Problem Relation Age of Onset  . Family history unknown: Yes    Social History Social History  Substance Use Topics  . Smoking status: Former Smoker    Types: Cigarettes  . Smokeless tobacco: Never Used  . Alcohol use No     Allergies   Ibuprofen   Review of Systems Review of Systems  All other systems reviewed and are negative.    Physical Exam Updated Vital Signs BP 125/85 (BP Location: Left Arm)   Pulse 72   Temp 98.1 F (36.7 C) (Oral)   Resp 18   Ht 5\' 9"  (1.753 m)   Wt 62.8 kg   SpO2 100%   BMI 20.45 kg/m   Physical Exam  Constitutional: He is oriented to person, place, and time. He appears well-developed and well-nourished.  Then chronically ill-appearing male, appears older than stated age, appears extremely uncomfortable unable to sit still, writhing around sitting in ER gurney  HENT:  Head: Normocephalic and atraumatic.  Nose: Nose normal.  Eyes: Conjunctivae are normal. Pupils are equal, round, and reactive to light. Right eye exhibits no discharge. Left eye exhibits no discharge.  Neck: Normal range of motion. Neck supple.  Cardiovascular: Normal rate and intact distal pulses.   Pulmonary/Chest: Effort normal. No stridor. No respiratory distress.  Abdominal: Soft. Bowel sounds are normal. He exhibits no distension and no mass. There is no tenderness. There is no guarding.  No CVA tenderness  Musculoskeletal: He exhibits edema and tenderness.  No midline tenderness from cervical to lumbar spine, no step-offs, no paraspinal tenderness and cervical and thoracic spine. Right paraspinal tenderness to palpation to lumbar spine Positives straight leg raise Right knee with abrasion with  surrounding edema and erythema.  Limited flexion past 90 secondary to pain. Positive for effusion.  Prepatellar swelling and erythema. Right anterior knee diffusely tender to palpation  Neurological: He is alert and oriented to person, place, and time. He exhibits normal muscle tone.  Antalgic gait Normal sensation to light touch in all extremities Normal strength remedies  Skin: Skin is warm. Capillary refill takes less than 2 seconds. No rash noted. He is not diaphoretic. There is erythema.  Psychiatric: He has a normal mood and affect. His behavior is normal.  Nursing note and vitals reviewed.    ED Treatments / Results   DIAGNOSTIC STUDIES:  Oxygen Saturation is 100% on RA, nml by my interpretation.    COORDINATION OF CARE:  11:37 AM Discussed treatment plan with pt at bedside and pt agreed to plan.   Labs (all labs ordered are listed, but only abnormal results are displayed) Labs Reviewed - No data to display  EKG  EKG  Interpretation None       Radiology Dg Knee Complete 4 Views Right  Result Date: 11/17/2015 CLINICAL DATA:  Contusion with swelling and redness. [place] off bike 2 days ago. EXAM: RIGHT KNEE - COMPLETE 4+ VIEW COMPARISON:  None. FINDINGS: There is prepatellar and suprapatellar soft tissue swelling. There is probably a small amount of joint fluid. No evidence fracture of the femur, tibia or fibula. There are small osteophytes at the quadriceps tendon attachment of the patella. Cannot rule out minimal fracture in that location. However, this appearance can be baseline normal. IMPRESSION: Prepatellar and suprapatellar soft tissue swelling. Small joint effusion. Superior patellar osteophytes/ tendon attachment with a lucency that could represent a fracture or could be baseline normal appearance. Electronically Signed   By: Nelson Chimes M.D.   On: 11/17/2015 12:41    Procedures Procedures (including critical care time)  Medications Ordered in ED Medications    bacitracin ointment 1 application (1 application Topical Given 11/17/15 1323)  ketorolac (TORADOL) injection 60 mg (60 mg Intramuscular Given 11/17/15 1141)  dexamethasone (DECADRON) injection 10 mg (10 mg Intramuscular Given 11/17/15 1141)  cephALEXin (KEFLEX) capsule 1,000 mg (1,000 mg Oral Given 11/17/15 1323)     Initial Impression / Assessment and Plan / ED Course  I have reviewed the triage vital signs and the nursing notes.  Pertinent labs & imaging results that were available during my care of the patient were reviewed by me and considered in my medical decision making (see chart for details).  Clinical Course    55 year old male presents to the ER with 2 days of gradually worsening and severe right low back pain with radiation down right leg, began after heavy lifting. He also had a fall onto his right knee that same day and has swelling and erythema limited range of motion and pain.  Concern for cellulitis given An abrasion and diffuse erythema, edema and tenderness. No drainage. Clinically also has a right knee effusion. We'll cover with antibiotics and patient will need to be reevaluated for response. His low back pain is most likely sciatica Normal neurological exam, no evidence of urinary incontinence or retention, pain is consistently reproducible. There is no evidence of AAA or concern for dissection at this time.   Patient can walk but states is painful.  No loss of bowel or bladder control.  No concern for cauda equina.  No fever, night sweats, weight loss, h/o cancer, IVDU.  Pain treated here in the department with adequate improvement. RICE protocol and pain medicine indicated and discussed with patient. I have also discussed reasons to return immediately to the ER.  Patient expresses understanding and agrees with plan.  Patient will get his medications IR see tomorrow morning he was given a dose of medication this afternoon. He will need to follow up with his PCP or seek or  through evaluation for reevaluation of his knee. It was difficult to fully evaluate his range of motion given his severe right low back pain. X-ray was pertinent for the following, "Prepatellar and suprapatellar soft tissue swelling. Small joint effusion. Superior patellar osteophytes/ tendon attachment with a lucency that could represent a fracture or could be baseline normal appearance."  The patient was given ortho follow-up, hope that his back pain will resolve and with reevaluation they will be able to obtain a better exam and treat appropriately.  We did discuss return precautions if the infection appears to spread. Patient verbalizes understanding. He has been hemodynamically stable while in the ER  and feel he is safe to discharge home.     Final Clinical Impressions(s) / ED Diagnoses   Final diagnoses:  Sciatica of right side  Right knee injury, initial encounter  Cellulitis of right lower extremity    New Prescriptions Discharge Medication List as of 11/17/2015  1:22 PM    START taking these medications   Details  bacitracin ointment Apply 1 application topically 2 (two) times daily., Starting Sun 11/17/2015, Print    cephALEXin (KEFLEX) 500 MG capsule Take 1 capsule (500 mg total) by mouth 4 (four) times daily., Starting Sun 11/17/2015, Print    cyclobenzaprine (FLEXERIL) 10 MG tablet Take 1 tablet (10 mg total) by mouth 2 (two) times daily as needed for muscle spasms., Starting Sun 11/17/2015, Print    oxyCODONE-acetaminophen (PERCOCET) 5-325 MG tablet Take 1 tablet by mouth every 4 (four) hours as needed., Starting Sun 11/17/2015, Print    predniSONE (DELTASONE) 20 MG tablet Take 2 tablets (40 mg total) by mouth daily., Starting Sun 11/17/2015, Print       I personally performed the services described in this documentation, which was scribed in my presence. The recorded information has been reviewed and is accurate.       Delsa Grana, PA-C 11/17/15 1337    Gareth Morgan, MD 11/21/15 1332

## 2015-11-17 NOTE — ED Triage Notes (Signed)
Patient complains of right lower back pain with radiation down right buttock and leg x 2 days, started after lifting shingles on Friday, ambulatory on arrival. NAD

## 2016-01-01 DIAGNOSIS — Z5900 Homelessness unspecified: Secondary | ICD-10-CM

## 2016-01-01 NOTE — Congregational Nurse Program (Signed)
Congregational Nurse Program Note  Date of Encounter: 01/01/2016  Past Medical History: Past Medical History:  Diagnosis Date  . Hep C w/o coma, chronic ([place])     Encounter Details:     CNP Questionnaire - 12/30/15 1353      Patient Demographics   Is this a new or existing patient? Existing   Patient is considered a/an Immigrant   Race African-American/Black     Patient Assistance   Location of Patient Assistance Not Applicable   Patient's financial/insurance status Low Income   Uninsured Patient Yes   Interventions Not Applicable   Patient referred to apply for the following financial assistance [place] insecurities addressed Not Applicable   Transportation assistance No   Assistance securing medications No   Educational health offerings Chronic disease;Medications     Encounter Details   Primary purpose of visit Chronic Illness/Condition Visit   Was an Emergency Department visit averted? Not Applicable   Does patient have a medical provider? Yes   Patient referred to Clinic   Was a mental health screening completed? (GAINS tool) No   Does patient have dental issues? No   Does patient have vision issues? No   Does your patient have an abnormal blood pressure today? No   Since previous encounter, have you referred patient for abnormal blood pressure that resulted in a new diagnosis or medication change? No   Does your patient have an abnormal blood glucose today? No   Since previous encounter, have you referred patient for abnormal blood glucose that resulted in a new diagnosis or medication change? No   Was there a life-saving intervention made? No     Wanted assistance with obtaining medications.  Since client has been seen by Marliss Coots NP - referred him back to her for follow up

## 2016-03-07 NOTE — Congregational Nurse Program (Signed)
Congregational Nurse Program Note  Date of Encounter: 02/26/2016  Past Medical History: Past Medical History:  Diagnosis Date  . Hep C w/o coma, chronic ([place])     Encounter Details:     CNP Questionnaire - 02/26/16 1858      Patient Demographics   Is this a new or existing patient? Existing   Patient is considered a/an Immigrant   Race African-American/Black     Patient Assistance   Location of Patient Assistance Not Applicable   Patient's financial/insurance status Low Income   Uninsured Patient ([facility]) Yes   Patient referred to apply for the following financial assistance [place] insecurities addressed Not Research scientist (physical sciences) No   Assistance securing medications No   Educational health offerings Chronic disease;Medications     Encounter Details   Primary purpose of visit Chronic Illness/Condition Visit   Was an Emergency Department visit averted? Not Applicable   Does patient have a medical provider? Yes   Patient referred to Clinic   Was a mental health screening completed? (GAINS tool) No   Does patient have dental issues? No   Does patient have vision issues? No   Does your patient have an abnormal blood pressure today? No   Since previous encounter, have you referred patient for abnormal blood pressure that resulted in a new diagnosis or medication change? No   Does your patient have an abnormal blood glucose today? No   Since previous encounter, have you referred patient for abnormal blood glucose that resulted in a new diagnosis or medication change? No   Was there a life-saving intervention made? No     Requested bus passes for a medical appointment.  Bus passes provided

## 2016-12-19 LAB — GLUCOSE, POCT (MANUAL RESULT ENTRY): POC Glucose: 101 mg/dl — AB (ref 70–99)

## 2017-01-19 LAB — GLUCOSE, POCT (MANUAL RESULT ENTRY): POC GLUCOSE: 96 mg/dL (ref 70–99)

## 2017-08-23 IMAGING — DX DG HIP (WITH OR WITHOUT PELVIS) 2-3V*R*
3 series · 3 of 3 positions shown · non-contrast
Comparison: None.

CLINICAL DATA: Fall onto concrete steps landing on right side
yesterday. Right hip pain.

EXAM:
DG HIP (WITH OR WITHOUT PELVIS) 2-3V RIGHT

[t pelvis ap]
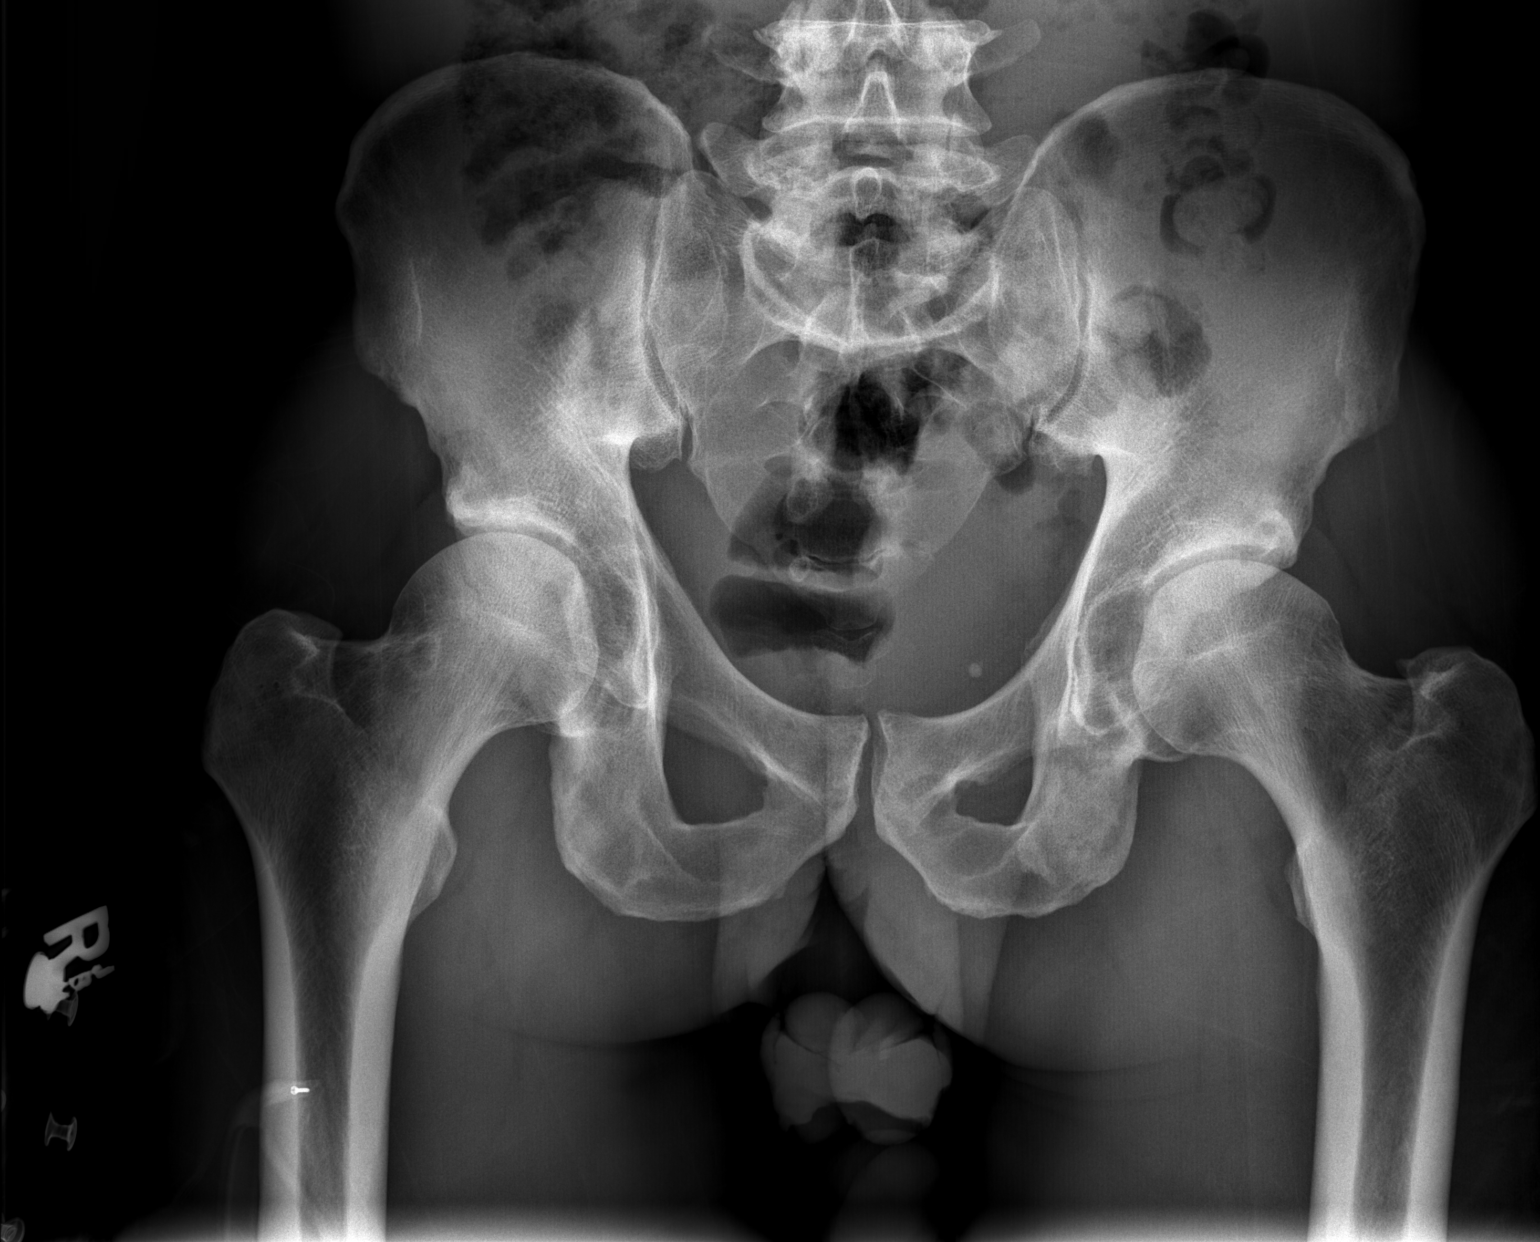

[t hip ap right]
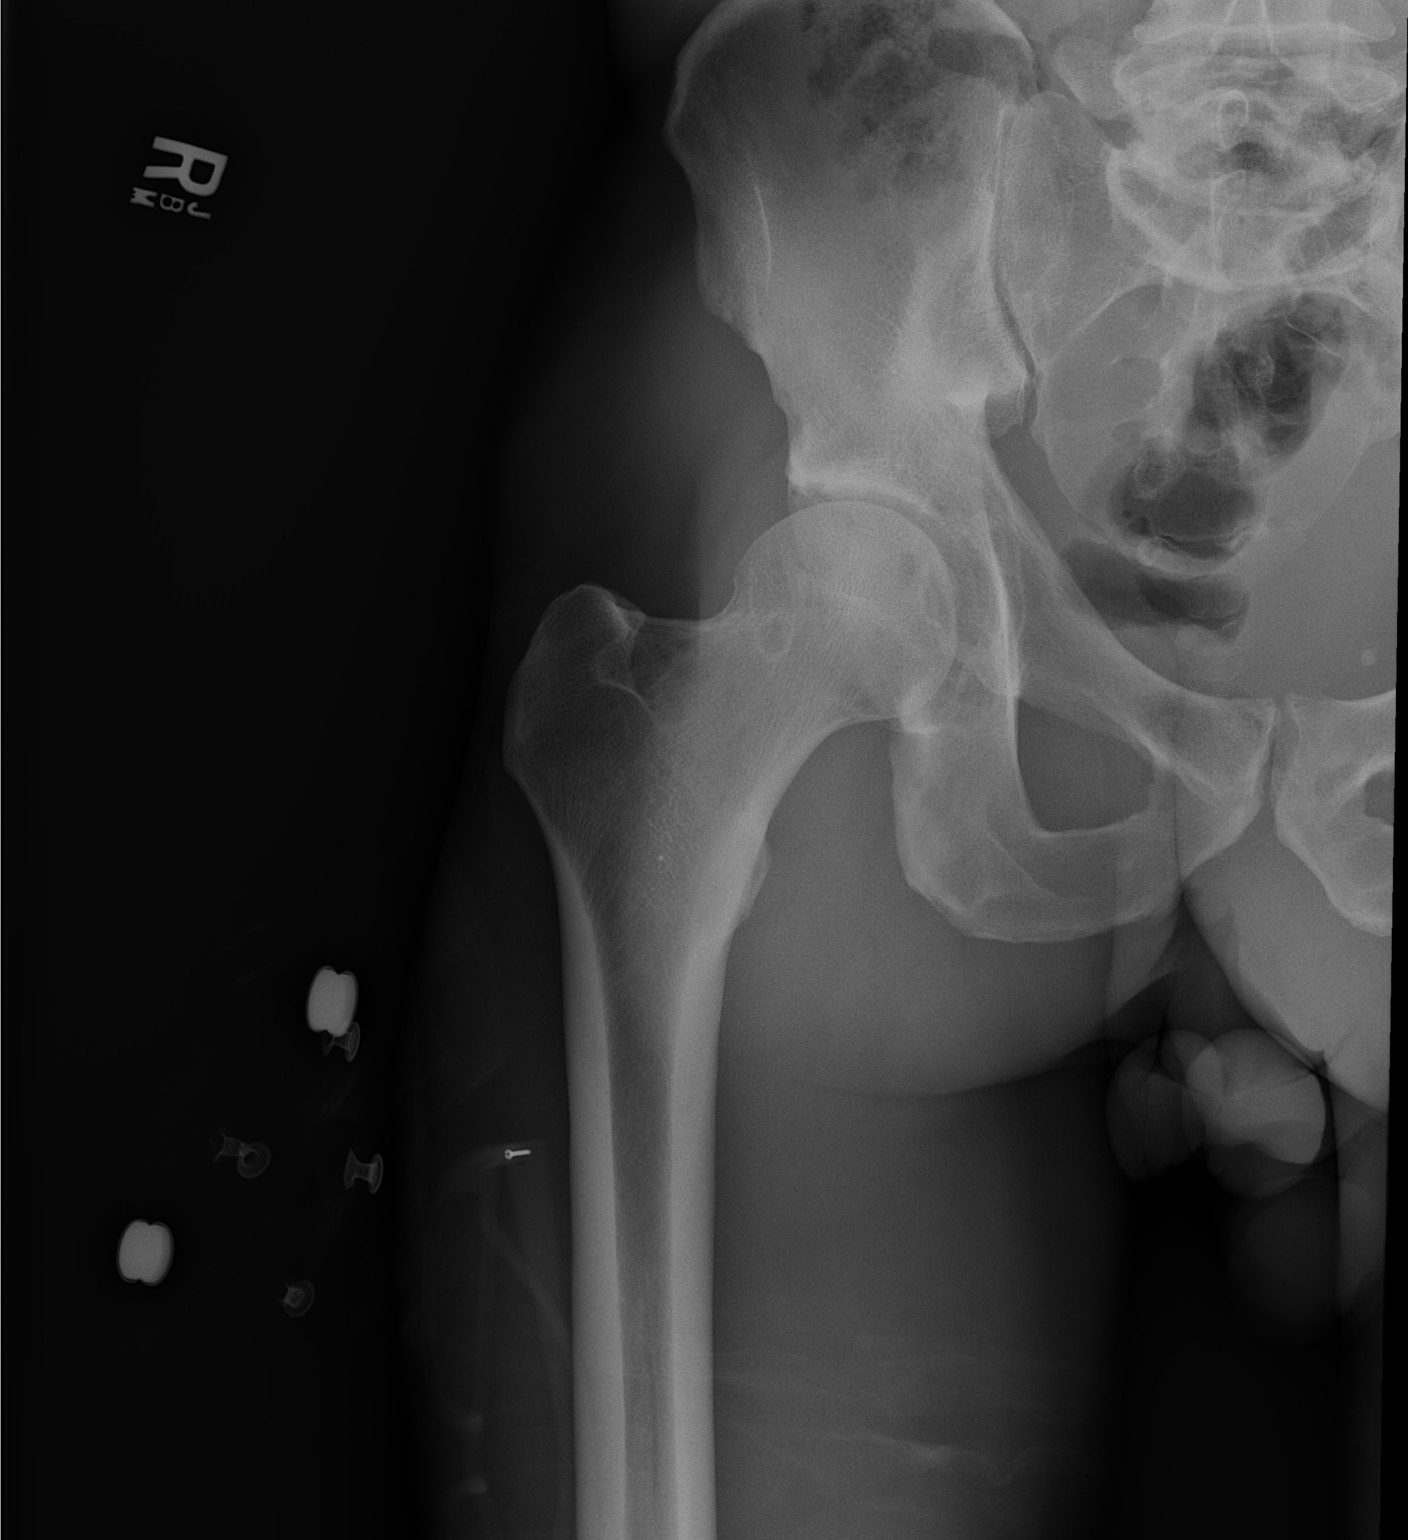

[t hip frog leg right]
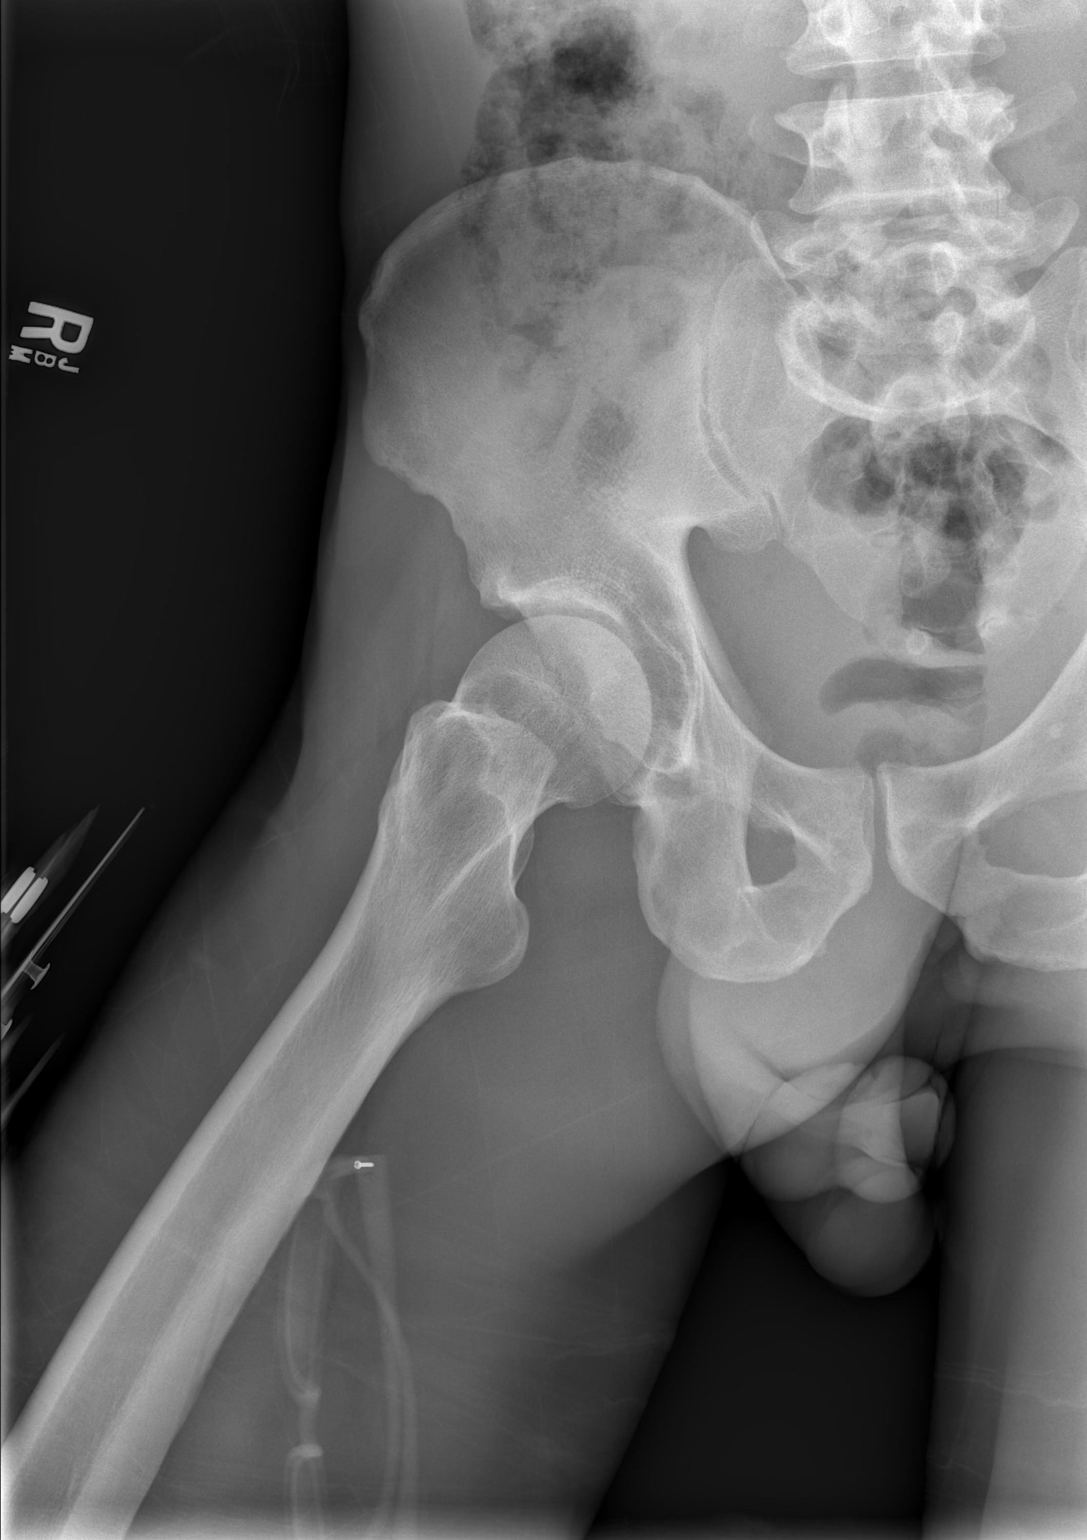

[3 of 3 positions shown; findings below may reference images not displayed]

FINDINGS: Examination demonstrates minimal symmetric degenerative changes of
the hips. Mild degenerate change of the right sacroiliac joint and
spine. No acute fracture or dislocation.
IMPRESSION: No acute findings.

## 2017-08-23 IMAGING — CT CT MAXILLOFACIAL W/O CM
2 of 3 series · 11 of 47 positions shown, 13 images · non-contrast
Comparison: CT scan of the head dated 05/13/2014

CLINICAL DATA: Left-sided facial trauma secondary to a fall on
stairs yesterday. Bruising to the left cheek and pain in the left
side of the mandible.

EXAM:
CT MAXILLOFACIAL WITHOUT CONTRAST
TECHNIQUE: Multidetector CT imaging of the maxillofacial structures was
performed. Multiplanar CT image reconstructions were also generated.
A small metallic BB was placed on the right temple in order to
reliably differentiate right from left.

[Series 207: cor st · coronal · 0.34mm/px · 8 of 65 slices shown, 10 images]
[im 8/65  brain]
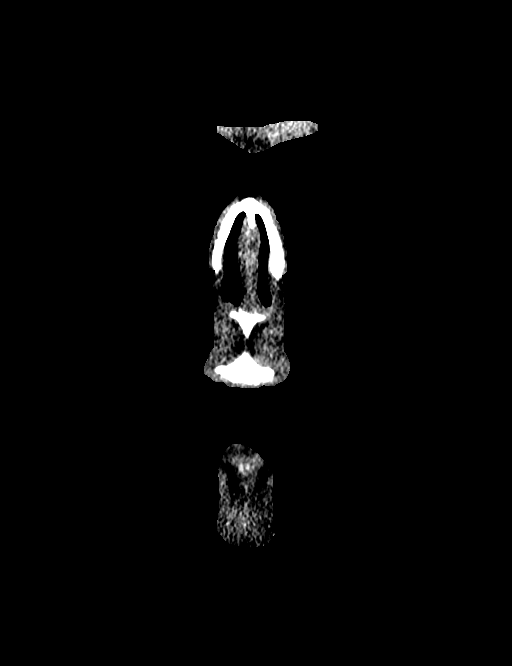
[im 8/65  bone]
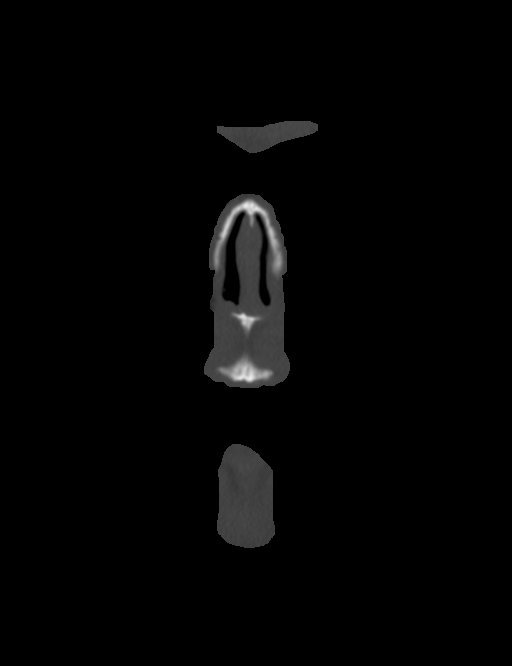
[im 15/65  bone]
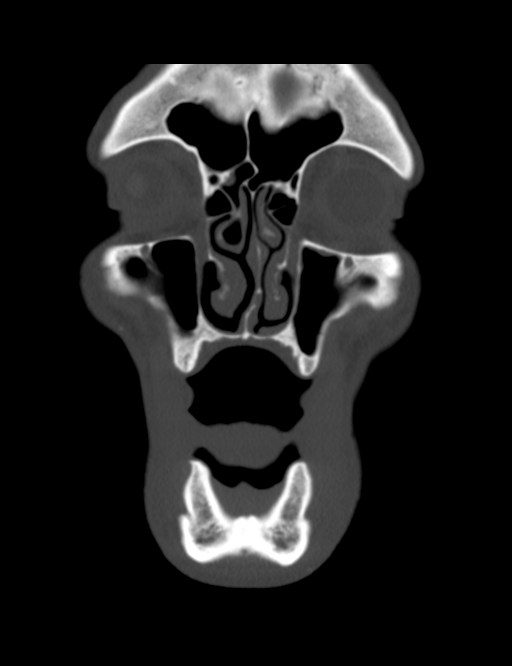
[im 22/65  bone]
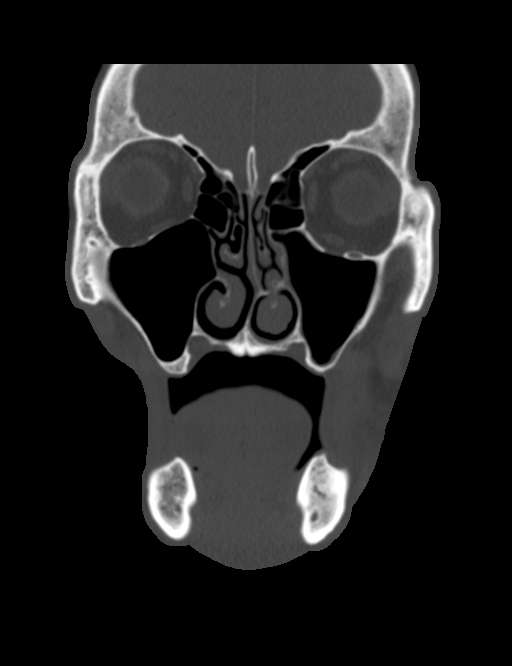
[im 29/65  bone]
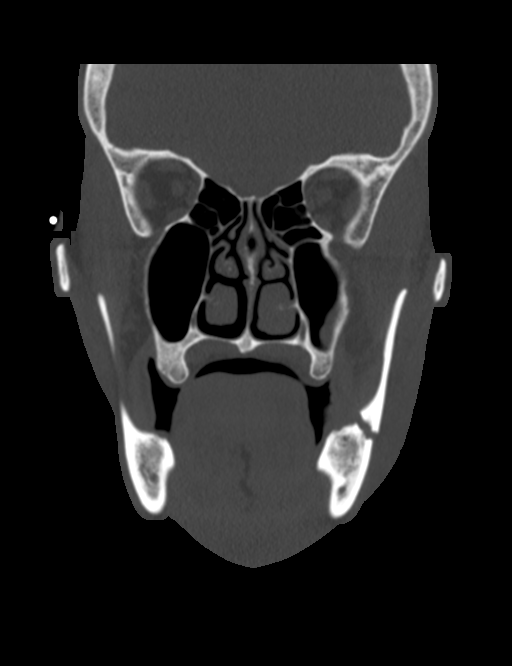
[im 36/65  brain]
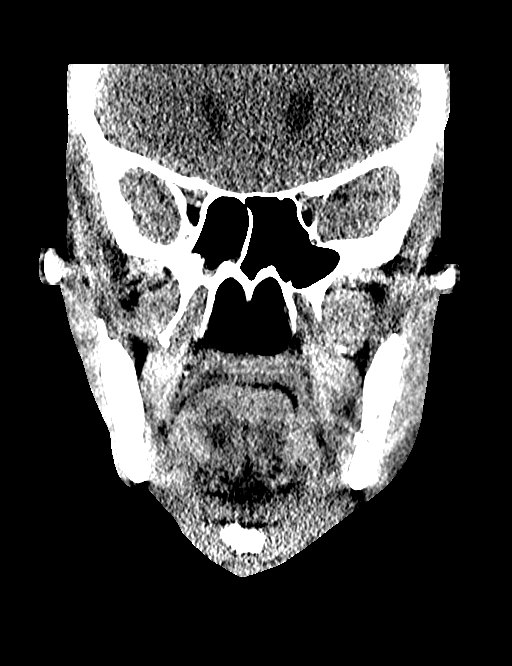
[im 36/65  bone]
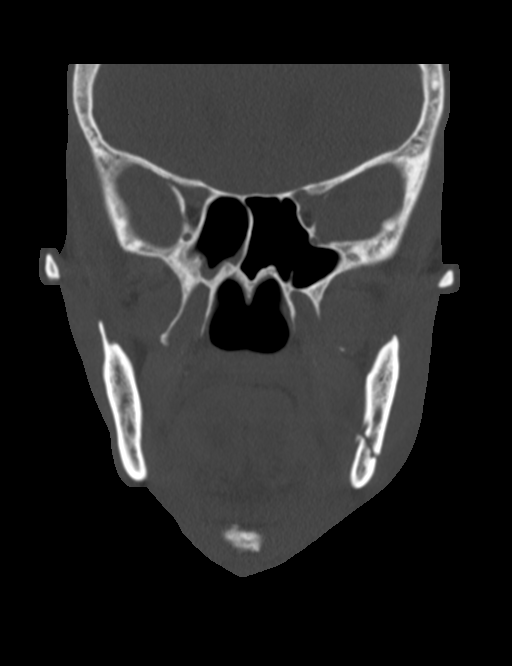
[im 43/65  bone]
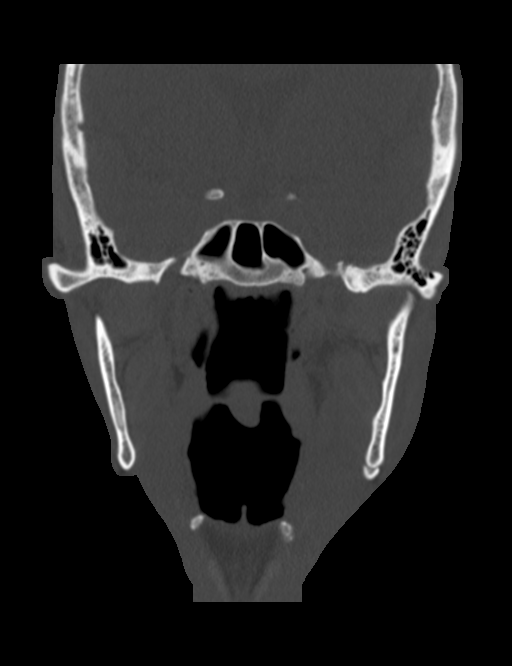
[im 50/65  bone]
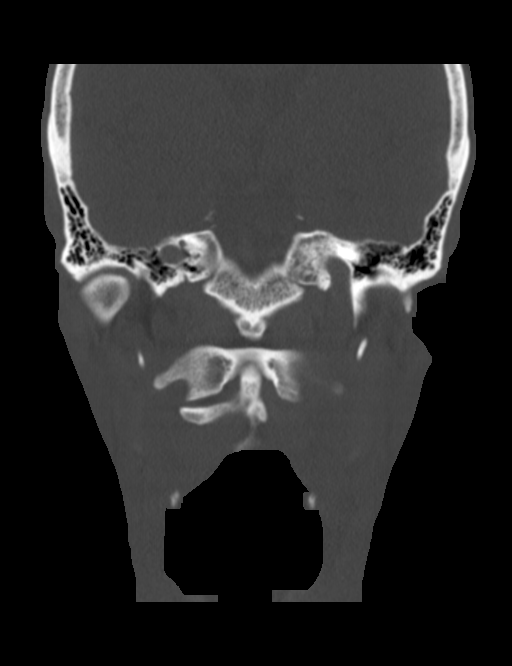
[im 57/65  bone]
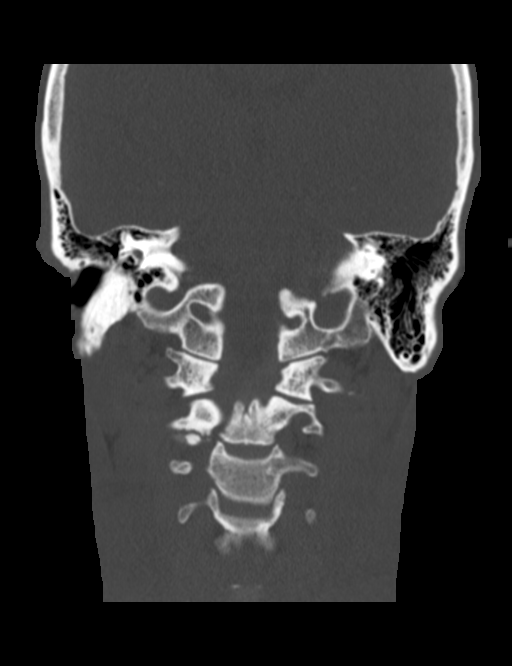

[Series 208: sag st · sagittal · 0.34mm/px · 3 of 79 slices shown]
[im 27/79  bone]
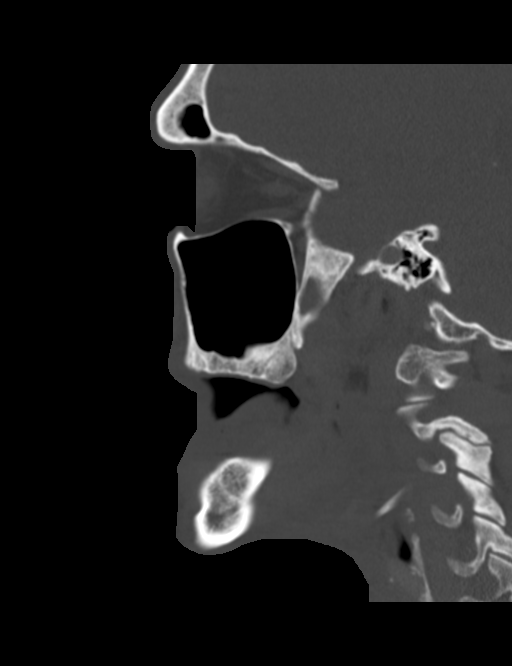
[im 40/79  bone]
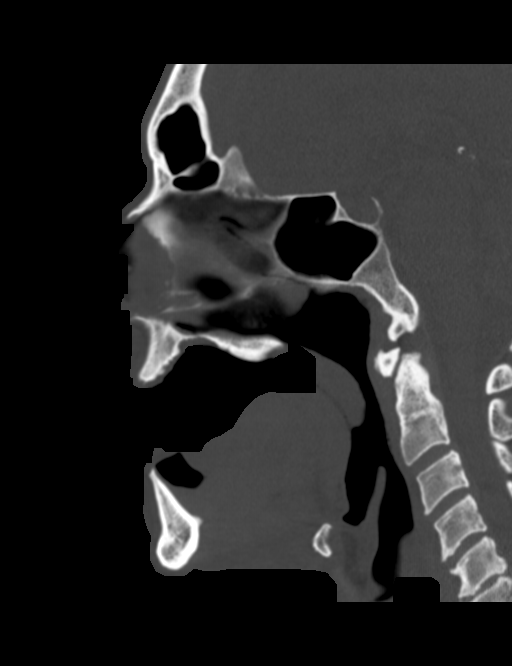
[im 53/79  bone]
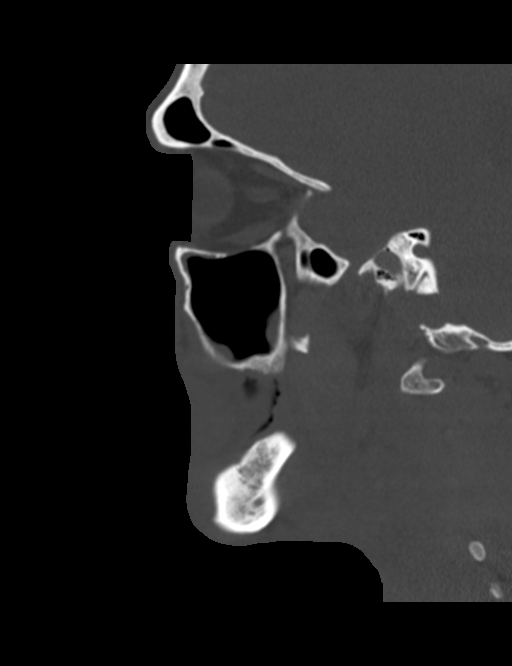

[11 of 47 positions shown; findings below may reference images not displayed]

FINDINGS: There is an oblique slightly distracted fracture through the angle
of the left side of the mandible. No other fractures. The patient is
edentulous.

Orbits appear normal. Minimal mucosal thickening in the left
maxillary sinus.

Degenerative disc disease at C5-6. Moderately severe right facet
arthritis at C2-3.

Slight nasal septal deviation from right to left which is chronic.
IMPRESSION: Slightly distracted oblique fracture through the angle of the left
side of the mandible.

## 2018-07-19 ENCOUNTER — Other Ambulatory Visit: Payer: Self-pay

## 2018-07-19 ENCOUNTER — Ambulatory Visit: Payer: Self-pay | Admitting: Gerontology

## 2018-07-19 ENCOUNTER — Encounter: Payer: Self-pay | Admitting: Gerontology

## 2018-07-19 DIAGNOSIS — Z7689 Persons encountering health services in other specified circumstances: Secondary | ICD-10-CM

## 2018-07-19 DIAGNOSIS — Z8619 Personal history of other infectious and parasitic diseases: Secondary | ICD-10-CM

## 2018-07-19 NOTE — Progress Notes (Signed)
Patient ID: Richard Bauer, male   DOB: January 02, 1961, 58 y.o.   MRN: [id number]  Chief Complaint  Patient presents with  . New Patient (Initial Visit)    establish care  Patient consents to telephone visit and 2 patient identifier was used to identify patient.  HPI Richard Bauer is a 58 y.o. male. presents to establish care, and evaluation of history of hepatitis B infection that was diagnosed in 2017,and no documentation was found. He reports that he's currently at the [facility] facility. He denies right upper quadrant abdominal pain and jaundice. He states that he smokes 1 pack of cigarette in 3 days. He denies chest pain, palpitation, fever, chills and no further concerns.   Past Medical History:  Diagnosis Date  . Hep C w/o coma, chronic ([facility])     Past Surgical History:  Procedure Laterality Date  . ORIF MANDIBULAR FRACTURE N/A 09/29/2015   Procedure: OPEN REDUCTION INTERNAL FIXATION (ORIF) MANDIBULAR FRACTURE;  Surgeon: Jerrell Belfast, MD;  Location: [facility] OR;  Service: ENT;  Laterality: N/A;    Family History  Family history unknown: Yes    Social History Social History   Tobacco Use  . Smoking status: Former Smoker    Types: Cigarettes  . Smokeless tobacco: Never Used  Substance Use Topics  . Alcohol use: No  . Drug use: No    Allergies  Allergen Reactions  . Ibuprofen Nausea And Vomiting and Swelling    No current outpatient medications on file.   No current facility-administered medications for this visit.     Review of Systems Review of Systems  Constitutional: Negative for chills and fever.  HENT: Negative.   Respiratory: Negative.   Cardiovascular: Negative.   Gastrointestinal: Negative.   Genitourinary: Negative.   Musculoskeletal: Negative.   Skin: Negative.   Neurological: Negative.   Psychiatric/Behavioral: Negative.       Physical Exam  No vital sign and PE done  Data Reviewed Past medical history and Routine labs was  ordered.  Assessment and Plan  1. Encounter to establish care Routine labs will be done on 08/17/18 - HgB A1c; Future - Comp Met (CMET); Future - CBC w/Diff; Future - TSH; Future - Lipid panel; Future - Urinalysis; Future  2. History of hepatitis B - He was advised to notify clinic for abdominal pain. -  Hepatitis, Acute panel will be checked and possible Hepatology referral.    Follow up in 1 month or if symptoms worsens. Richard Bauer E Natesha Hassey 07/19/2018, 2:57 PM

## 2018-08-17 ENCOUNTER — Other Ambulatory Visit: Payer: Self-pay

## 2018-08-17 DIAGNOSIS — Z7689 Persons encountering health services in other specified circumstances: Secondary | ICD-10-CM

## 2018-08-17 DIAGNOSIS — Z8619 Personal history of other infectious and parasitic diseases: Secondary | ICD-10-CM

## 2018-08-18 LAB — LIPID PANEL
Chol/HDL Ratio: 3.6 ratio (ref 0.0–5.0)
Cholesterol, Total: 188 mg/dL (ref 100–199)
HDL: 52 mg/dL (ref >39)
LDL Calculated: 108 mg/dL — ABNORMAL HIGH (ref 0–99)
Triglycerides: 139 mg/dL (ref 0–149)
VLDL Cholesterol Cal: 28 mg/dL (ref 5–40)

## 2018-08-18 LAB — CBC WITH DIFFERENTIAL/PLATELET
Basophils Absolute: 0 10*3/uL (ref 0.0–0.2)
Basos: 0 %
EOS (ABSOLUTE): 0.1 10*3/uL (ref 0.0–0.4)
Eos: 2 %
Hematocrit: 45.1 % (ref 37.5–51.0)
Hemoglobin: 15.3 g/dL (ref 13.0–17.7)
Immature Grans (Abs): 0 10*3/uL (ref 0.0–0.1)
Immature Granulocytes: 0 %
Lymphocytes Absolute: 2.1 10*3/uL (ref 0.7–3.1)
Lymphs: 44 %
MCH: 32.3 pg (ref 26.6–33.0)
MCHC: 33.9 g/dL (ref 31.5–35.7)
MCV: 95 fL (ref 79–97)
Monocytes Absolute: 0.5 10*3/uL (ref 0.1–0.9)
Monocytes: 11 %
Neutrophils Absolute: 2.1 10*3/uL (ref 1.4–7.0)
Neutrophils: 43 %
Platelets: 276 10*3/uL (ref 150–450)
RBC: 4.73 x10E6/uL (ref 4.14–5.80)
RDW: 13 % (ref 11.6–15.4)
WBC: 4.8 10*3/uL (ref 3.4–10.8)

## 2018-08-18 LAB — COMPREHENSIVE METABOLIC PANEL
ALT: 175 IU/L — ABNORMAL HIGH (ref 0–44)
AST: 87 IU/L — ABNORMAL HIGH (ref 0–40)
Albumin/Globulin Ratio: 1.6 (ref 1.2–2.2)
Albumin: 4.4 g/dL (ref 3.8–4.9)
Alkaline Phosphatase: 67 IU/L (ref 39–117)
BUN/Creatinine Ratio: 11 (ref 9–20)
BUN: 12 mg/dL (ref 6–24)
Bilirubin Total: 0.6 mg/dL (ref 0.0–1.2)
CO2: 23 mmol/L (ref 20–29)
Calcium: 9.8 mg/dL (ref 8.7–10.2)
Chloride: 102 mmol/L (ref 96–106)
Creatinine, Ser: 1.07 mg/dL (ref 0.76–1.27)
GFR calc Af Amer: 90 mL/min/{1.73_m2} (ref >59)
GFR calc non Af Amer: 78 mL/min/{1.73_m2} (ref >59)
Globulin, Total: 2.8 g/dL (ref 1.5–4.5)
Glucose: 108 mg/dL — ABNORMAL HIGH (ref 65–99)
Potassium: 4.2 mmol/L (ref 3.5–5.2)
Sodium: 137 mmol/L (ref 134–144)
Total Protein: 7.2 g/dL (ref 6.0–8.5)

## 2018-08-18 LAB — URINALYSIS
Bilirubin, UA: NEGATIVE
Glucose, UA: NEGATIVE
Ketones, UA: NEGATIVE
Leukocytes,UA: NEGATIVE
Nitrite, UA: NEGATIVE
Protein,UA: NEGATIVE
RBC, UA: NEGATIVE
Specific Gravity, UA: 1.016 (ref 1.005–1.030)
Urobilinogen, Ur: 0.2 mg/dL (ref 0.2–1.0)
pH, UA: 8 — ABNORMAL HIGH (ref 5.0–7.5)

## 2018-08-18 LAB — HEPATITIS PANEL, ACUTE
Hep A IgM: NEGATIVE
Hep B C IgM: NEGATIVE
Hep C Virus Ab: >11 s/co ratio — ABNORMAL HIGH (ref 0.0–0.9)
Hepatitis B Surface Ag: NEGATIVE

## 2018-08-18 LAB — HEMOGLOBIN A1C
Est. average glucose Bld gHb Est-mCnc: 105 mg/dL
Hgb A1c MFr Bld: 5.3 % (ref 4.8–5.6)

## 2018-08-18 LAB — TSH: TSH: 0.754 u[IU]/mL (ref 0.450–4.500)

## 2018-08-23 ENCOUNTER — Ambulatory Visit: Payer: Self-pay | Admitting: Gerontology

## 2018-08-23 ENCOUNTER — Other Ambulatory Visit: Payer: Self-pay

## 2018-08-23 ENCOUNTER — Encounter: Payer: Self-pay | Admitting: Gerontology

## 2018-08-23 DIAGNOSIS — R748 Abnormal levels of other serum enzymes: Secondary | ICD-10-CM

## 2018-08-23 DIAGNOSIS — K739 Chronic hepatitis, unspecified: Secondary | ICD-10-CM

## 2018-08-23 DIAGNOSIS — H538 Other visual disturbances: Secondary | ICD-10-CM

## 2018-08-23 NOTE — Progress Notes (Signed)
Established Patient Office Visit  Subjective:  Patient ID: Richard Bauer, male    DOB: 1960-10-01  Age: 58 y.o. MRN: [id number]  CC: No chief complaint on file. Patient consents to telephone visit, and two patient identifiers was used to identify patient.  HPI Richard Bauer presents for follow up visit and lab review. He reports that he has a history of hepatitis C and has not been treated. His liver enzymes done on 08/17/18 were elevated, AST 87, ALT 175 and Hep C was elevated. He denies abdominal pain, dark colored urine or jaundice. 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 He denies chest pain, palpitation, fever, chills and no further concerns.  Past Medical History:  Diagnosis Date  . Hep C w/o coma, chronic (HCC)     Past Surgical History:  Procedure Laterality Date  . ORIF MANDIBULAR FRACTURE N/A 09/29/2015   Procedure: OPEN REDUCTION INTERNAL FIXATION (ORIF) MANDIBULAR FRACTURE;  Surgeon: Jerrell Belfast, MD;  Location: [facility] OR;  Service: ENT;  Laterality: N/A;    Family History  Family history unknown: Yes    Social History   Socioeconomic History  . Marital status: Single    Spouse name: Not on file  . Number of children: Not on file  . Years of education: Not on file  . Highest education level: Not on file  Occupational History  . Not on file  Social Needs  . Financial resource strain: Not on file  . Food insecurity:    Worry: Not on file    Inability: Not on file  . Transportation needs:    Medical: Not on file    Non-medical: Not on file  Tobacco Use  . Smoking status: Former Smoker    Types: Cigarettes  . Smokeless tobacco: Never Used  Substance and Sexual Activity  . Alcohol use: No  . Drug use: No  . Sexual activity: Not on file  Lifestyle  . Physical activity:    Days per week: Not on file    Minutes per session: Not on file  . Stress: Not on file  Relationships  . Social connections:   Talks on phone: Not on file    Gets together: Not on file    Attends religious service: Not on file    Active member of club or organization: Not on file    Attends meetings of clubs or organizations: Not on file    Relationship status: Not on file  . Intimate partner violence:    Fear of current or ex partner: Not on file    Emotionally abused: Not on file    Physically abused: Not on file    Forced sexual activity: Not on file  Other Topics Concern  . Not on file  Social History Narrative  . Not on file    No outpatient medications prior to visit.   No facility-administered medications prior to visit.     Allergies  Allergen Reactions  . Ibuprofen Nausea And Vomiting and Swelling    ROS Review of Systems  Constitutional: Negative.   Eyes: Positive for visual disturbance (blurry vision).  Respiratory: Negative.   Cardiovascular: Negative.   Gastrointestinal: Negative.   Genitourinary: Negative.   Musculoskeletal: Negative.   Skin: Negative.   Neurological: Negative.   Psychiatric/Behavioral: Negative.       Objective:    Physical Exam No vital sign or PE done There were no vitals taken for this visit. Wt  Readings from Last 3 Encounters:  11/17/15 138 lb 8 oz (62.8 kg)  09/29/15 150 lb (68 kg)  05/13/14 165 lb (74.8 kg)     Health Maintenance Due  Topic Date Due  . HIV Screening  08/19/1977  . TETANUS/TDAP  08/19/1981  . COLONOSCOPY  08/19/2012    There are no preventive care reminders to display for this patient.  Lab Results  Component Value Date   TSH 0.754 08/17/2018   Lab Results  Component Value Date   WBC 4.8 08/17/2018   HGB 15.3 08/17/2018   HCT 45.1 08/17/2018   MCV 95 08/17/2018   PLT 276 08/17/2018   Lab Results  Component Value Date   NA 137 08/17/2018   K 4.2 08/17/2018   CO2 23 08/17/2018   GLUCOSE 108 (H) 08/17/2018   BUN 12 08/17/2018   CREATININE 1.07 08/17/2018   BILITOT 0.6 08/17/2018   ALKPHOS 67 08/17/2018    AST 87 (H) 08/17/2018   ALT 175 (H) 08/17/2018   PROT 7.2 08/17/2018   ALBUMIN 4.4 08/17/2018   CALCIUM 9.8 08/17/2018   ANIONGAP 6 09/29/2015   Lab Results  Component Value Date   CHOL 188 08/17/2018   Lab Results  Component Value Date   HDL 52 08/17/2018   Lab Results  Component Value Date   LDLCALC 108 (H) 08/17/2018   He was advised to continue on low fat and low cholesterol diet. Lab Results  Component Value Date   TRIG 139 08/17/2018   Lab Results  Component Value Date   CHOLHDL 3.6 08/17/2018   Lab Results  Component Value Date   HGBA1C 5.3 08/17/2018      Assessment & Plan:    1. Elevated liver enzymes - He was encouraged to complete charity care application for - Amb Referral to Hepatology  2. Chronic hepatitis ([place]) - Same in #1 - Amb Referral to Hepatology  3. Blurry vision, left eye - Ambulatory Ophthalmology referral.   Follow-up: Return in about 2 months (around 10/23/2018), or if symptoms worsen or fail to improve.    Kynzlee Hucker Jerold Coombe, NP

## 2018-09-01 ENCOUNTER — Other Ambulatory Visit: Payer: Self-pay

## 2018-09-01 ENCOUNTER — Other Ambulatory Visit
Admission: RE | Admit: 2018-09-01 | Discharge: 2018-09-01 | Disposition: A | Discharge status: Home / Self Care | Payer: Self-pay | Attending: Gastroenterology | Admitting: Gastroenterology

## 2018-09-01 ENCOUNTER — Encounter: Payer: Self-pay | Admitting: Gastroenterology

## 2018-09-01 ENCOUNTER — Ambulatory Visit (INDEPENDENT_AMBULATORY_CARE_PROVIDER_SITE_OTHER): Payer: Self-pay | Admitting: Gastroenterology

## 2018-09-01 VITALS — BP 127/73 | HR 94 | Temp 98.3°F | Ht 69.0 in | Wt 170.2 lb

## 2018-09-01 DIAGNOSIS — B182 Chronic viral hepatitis C: Secondary | ICD-10-CM

## 2018-09-01 DIAGNOSIS — Z1211 Encounter for screening for malignant neoplasm of colon: Secondary | ICD-10-CM

## 2018-09-01 LAB — IRON AND TIBC
Iron: 128 ug/dL (ref 45–182)
Saturation Ratios: 34 % (ref 17.9–39.5)
TIBC: 376 ug/dL (ref 250–450)
UIBC: 248 ug/dL

## 2018-09-01 LAB — HEPATIC FUNCTION PANEL
ALT: 122 U/L — ABNORMAL HIGH (ref 0–44)
AST: 66 U/L — ABNORMAL HIGH (ref 15–41)
Albumin: 4.3 g/dL (ref 3.5–5.0)
Alkaline Phosphatase: 62 U/L (ref 38–126)
Bilirubin, Direct: 0.1 mg/dL (ref 0.0–0.2)
Indirect Bilirubin: 0.8 mg/dL (ref 0.3–0.9)
Total Bilirubin: 0.9 mg/dL (ref 0.3–1.2)
Total Protein: 8.2 g/dL — ABNORMAL HIGH (ref 6.5–8.1)

## 2018-09-01 LAB — FERRITIN: Ferritin: 77 ng/mL (ref 24–336)
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
## 2018-09-01 NOTE — Patient Instructions (Signed)
You are scheduled for a RUQ abdominal US/elastography at [facility] on Tuesday, June 9th at 10:30am. Please arrive at the Medical mall registration desk at 10:15am. You cannot have anything to eat or drink after midnight on Monday night.   If you need to reschedule this appointment for any reason, please contact central scheduling at [phone].

## 2018-09-02 LAB — HCV RNA QUANT
HCV Quantitative Log: 6.26 log10 IU/mL (ref >1.70)
HCV Quantitative: 1820000 IU/mL (ref >50)

## 2018-09-02 LAB — CERULOPLASMIN: Ceruloplasmin: 23.8 mg/dL (ref 16.0–31.0)

## 2018-09-02 LAB — HEPATITIS B SURFACE ANTIGEN: Hepatitis B Surface Ag: NEGATIVE

## 2018-09-02 LAB — HEPATITIS B SURFACE ANTIBODY,QUALITATIVE: Hep B S Ab: REACTIVE

## 2018-09-02 LAB — ANTI-SMOOTH MUSCLE ANTIBODY, IGG: F-Actin IgG: 14 Units (ref 0–19)

## 2018-09-02 LAB — MITOCHONDRIAL ANTIBODIES: Mitochondrial M2 Ab, IgG: <20 Units (ref 0.0–20.0)

## 2018-09-02 LAB — ANA: Anti Nuclear Antibody (ANA): NEGATIVE

## 2018-09-02 LAB — HEPATITIS A ANTIBODY, TOTAL: hep A Total Ab: POSITIVE — AB

## 2018-09-02 LAB — ALPHA-1-ANTITRYPSIN: A-1 Antitrypsin, Ser: 129 mg/dL (ref 101–187)

## 2018-09-04 LAB — HEPATITIS C GENOTYPE

## 2018-09-05 ENCOUNTER — Other Ambulatory Visit: Payer: Self-pay

## 2018-09-05 ENCOUNTER — Encounter: Payer: Self-pay | Admitting: *Deleted

## 2018-09-06 ENCOUNTER — Encounter: Payer: Self-pay | Admitting: Anesthesiology

## 2018-09-06 ENCOUNTER — Ambulatory Visit: Payer: HRSA Program

## 2018-09-06 ENCOUNTER — Other Ambulatory Visit
Admission: RE | Admit: 2018-09-06 | Discharge: 2018-09-06 | Disposition: A | Discharge status: Home / Self Care | Payer: HRSA Program | Source: Ambulatory Visit | Attending: Gastroenterology | Admitting: Gastroenterology

## 2018-09-06 DIAGNOSIS — Z1159 Encounter for screening for other viral diseases: Secondary | ICD-10-CM | POA: Diagnosis not present

## 2018-09-06 DIAGNOSIS — Z01812 Encounter for preprocedural laboratory examination: Secondary | ICD-10-CM | POA: Insufficient documentation

## 2018-09-07 ENCOUNTER — Telehealth: Payer: Self-pay

## 2018-09-07 ENCOUNTER — Other Ambulatory Visit: Payer: Self-pay

## 2018-09-07 LAB — NOVEL CORONAVIRUS, NAA (HOSP ORDER, SEND-OUT TO REF LAB; TAT 18-24 HRS): SARS-CoV-2, NAA: NOT DETECTED

## 2018-09-07 NOTE — Telephone Encounter (Signed)
Pt notified of lab results.   Pt was rescheduled for his RUQ abdominal US to Tuesday, June 16th at 9:30am at [facility]. Pt has been advised to arrive at the medical mall registration desk at 9:15am and to be NPO after midnight on Monday night. Pt verbalized understanding of these instructions.

## 2018-09-07 NOTE — Telephone Encounter (Signed)
-----   Message from [name], MD sent at 09/05/2018 11:42 AM EDT ----- This patient will need to be started on treatment for his hepatitis C.

## 2018-09-07 NOTE — Telephone Encounter (Signed)
-----   Message from [name], MD sent at 09/02/2018  6:48 PM EDT ----- The patient does not need any vaccinations but his genotype and fibrosis scan comes back he will need to be treated for his hepatitis C.

## 2018-09-07 NOTE — Telephone Encounter (Signed)
Pt notified of results

## 2018-09-08 NOTE — Discharge Instructions (Signed)
General Anesthesia, Adult, Care After  This sheet gives you information about how to care for yourself after your procedure. Your health care provider may also give you more specific instructions. If you have problems or questions, contact your health care provider.  What can I expect after the procedure?  After the procedure, the following side effects are common:  Pain or discomfort at the IV site.  Nausea.  Vomiting.  Sore throat.  Trouble concentrating.  Feeling cold or chills.  Weak or tired.  Sleepiness and fatigue.  Soreness and body aches. These side effects can affect parts of the body that were not involved in surgery.  Follow these instructions at home:    For at least 24 hours after the procedure:  Have a responsible adult stay with you. It is important to have someone help care for you until you are awake and alert.  Rest as needed.  Do not:  Participate in activities in which you could fall or become injured.  Drive.  Use heavy machinery.  Drink alcohol.  Take sleeping pills or medicines that cause drowsiness.  Make important decisions or sign legal documents.  Take care of children on your own.  Eating and drinking  Follow any instructions from your health care provider about eating or drinking restrictions.  When you feel hungry, start by eating small amounts of foods that are soft and easy to digest (bland), such as toast. Gradually return to your regular diet.  Drink enough fluid to keep your urine pale yellow.  If you vomit, rehydrate by drinking water, juice, or clear broth.  General instructions  If you have sleep apnea, surgery and certain medicines can increase your risk for breathing problems. Follow instructions from your health care provider about wearing your sleep device:  Anytime you are sleeping, including during daytime naps.  While taking prescription pain medicines, sleeping medicines, or medicines that make you drowsy.  Return to your normal activities as told by your health care  provider. Ask your health care provider what activities are safe for you.  Take over-the-counter and prescription medicines only as told by your health care provider.  If you smoke, do not smoke without supervision.  Keep all follow-up visits as told by your health care provider. This is important.  Contact a health care provider if:  You have nausea or vomiting that does not get better with medicine.  You cannot eat or drink without vomiting.  You have pain that does not get better with medicine.  You are unable to pass urine.  You develop a skin rash.  You have a fever.  You have redness around your IV site that gets worse.  Get help right away if:  You have difficulty breathing.  You have chest pain.  You have blood in your urine or stool, or you vomit blood.  Summary  After the procedure, it is common to have a sore throat or nausea. It is also common to feel tired.  Have a responsible adult stay with you for the first 24 hours after general anesthesia. It is important to have someone help care for you until you are awake and alert.  When you feel hungry, start by eating small amounts of foods that are soft and easy to digest (bland), such as toast. Gradually return to your regular diet.  Drink enough fluid to keep your urine pale yellow.  Return to your normal activities as told by your health care provider. Ask your health care   provider what activities are safe for you.  This information is not intended to replace advice given to you by your health care provider. Make sure you discuss any questions you have with your health care provider.  Document Released: 06/22/2000 Document Revised: 10/30/2016 Document Reviewed: 10/30/2016  Elsevier Interactive Patient Education  2019 [employer]vier Inc[employer]

## 2018-09-09 ENCOUNTER — Ambulatory Visit: Admission: RE | Admit: 2018-09-09 | Payer: Self-pay | Source: Home / Self Care | Admitting: Gastroenterology

## 2018-09-09 HISTORY — DX: Carpal tunnel syndrome, bilateral upper limbs: G56.03

## 2018-09-09 HISTORY — DX: Unspecified osteoarthritis, unspecified site: M19.90

## 2018-09-09 SURGERY — COLONOSCOPY WITH PROPOFOL
Anesthesia: Choice

## 2018-09-12 ENCOUNTER — Telehealth: Payer: Self-pay | Admitting: Gastroenterology

## 2018-09-12 NOTE — Telephone Encounter (Signed)
Pt is calling again to reschedule his procedure

## 2018-09-12 NOTE — Telephone Encounter (Signed)
Pt left vm to reschedule his apt he ask that we call [phone] to reschedule with the medical staff there

## 2018-09-13 ENCOUNTER — Other Ambulatory Visit: Payer: Self-pay

## 2018-09-13 DIAGNOSIS — Z1211 Encounter for screening for malignant neoplasm of colon: Secondary | ICD-10-CM

## 2018-09-30 ENCOUNTER — Other Ambulatory Visit
Admission: RE | Admit: 2018-09-30 | Discharge: 2018-09-30 | Disposition: A | Discharge status: Home / Self Care | Payer: Self-pay | Source: Ambulatory Visit | Attending: Gastroenterology | Admitting: Gastroenterology

## 2018-09-30 ENCOUNTER — Other Ambulatory Visit: Payer: Self-pay

## 2018-09-30 DIAGNOSIS — Z1159 Encounter for screening for other viral diseases: Secondary | ICD-10-CM | POA: Insufficient documentation

## 2018-09-30 DIAGNOSIS — Z01812 Encounter for preprocedural laboratory examination: Secondary | ICD-10-CM | POA: Insufficient documentation

## 2018-09-30 LAB — SARS CORONAVIRUS 2 (TAT 6-24 HRS): SARS Coronavirus 2: NEGATIVE

## 2018-10-03 ENCOUNTER — Encounter: Payer: Self-pay | Admitting: Emergency Medicine

## 2018-10-04 ENCOUNTER — Ambulatory Visit: Payer: Self-pay | Admitting: Anesthesiology

## 2018-10-04 ENCOUNTER — Encounter: Payer: Self-pay | Admitting: Emergency Medicine

## 2018-10-04 ENCOUNTER — Encounter
Admission: RE | Disposition: A | Discharge status: Home / Self Care | Payer: Self-pay | Source: Ambulatory Visit | Attending: Gastroenterology

## 2018-10-04 ENCOUNTER — Other Ambulatory Visit: Payer: Self-pay

## 2018-10-04 ENCOUNTER — Ambulatory Visit
Admission: RE | Admit: 2018-10-04 | Discharge: 2018-10-04 | Disposition: A | Discharge status: Home / Self Care | Payer: Self-pay | Source: Ambulatory Visit | Attending: Gastroenterology | Admitting: Gastroenterology

## 2018-10-04 DIAGNOSIS — F17210 Nicotine dependence, cigarettes, uncomplicated: Secondary | ICD-10-CM | POA: Insufficient documentation

## 2018-10-04 DIAGNOSIS — D125 Benign neoplasm of sigmoid colon: Secondary | ICD-10-CM

## 2018-10-04 DIAGNOSIS — K635 Polyp of colon: Secondary | ICD-10-CM

## 2018-10-04 DIAGNOSIS — M1612 Unilateral primary osteoarthritis, left hip: Secondary | ICD-10-CM | POA: Insufficient documentation

## 2018-10-04 DIAGNOSIS — Z886 Allergy status to analgesic agent status: Secondary | ICD-10-CM | POA: Insufficient documentation

## 2018-10-04 DIAGNOSIS — Z1211 Encounter for screening for malignant neoplasm of colon: Secondary | ICD-10-CM | POA: Insufficient documentation

## 2018-10-04 HISTORY — PX: COLONOSCOPY WITH PROPOFOL: SHX5780

## 2018-10-04 SURGERY — COLONOSCOPY WITH PROPOFOL
Anesthesia: General

## 2018-10-04 MED ORDER — PROPOFOL 10 MG/ML IV BOLUS
INTRAVENOUS | Status: DC | PRN
  Administered 2018-10-04 (×5): 50 mg via INTRAVENOUS

## 2018-10-04 MED ORDER — PHENYLEPHRINE HCL (PRESSORS) 10 MG/ML IV SOLN
INTRAVENOUS | Status: DC | PRN
  Administered 2018-10-04: 100 ug via INTRAVENOUS

## 2018-10-04 MED ORDER — SODIUM CHLORIDE 0.9 % IV SOLN
INTRAVENOUS | Status: DC
  Administered 2018-10-04: 1000 mL via INTRAVENOUS

## 2018-10-04 MED ORDER — GLYCOPYRROLATE 0.2 MG/ML IJ SOLN
INTRAMUSCULAR | Status: DC | PRN
  Administered 2018-10-04: 0.2 mg via INTRAVENOUS

## 2018-10-04 MED ORDER — EPHEDRINE SULFATE 50 MG/ML IJ SOLN
INTRAMUSCULAR | Status: DC | PRN
  Administered 2018-10-04 (×2): 5 mg via INTRAVENOUS

## 2018-10-04 NOTE — Op Note (Signed)
[facility] Gastroenterology Patient Name: Richard Bauer Procedure Date: 10/04/2018 9:38 AM MRN: [id number] Account #: [id number] Date of Birth: 09-18-1960 Admit Type: Outpatient Age: 58 Room: [facility] ENDO ROOM 4 Gender: Male Note Status: Finalized Procedure:            Colonoscopy Indications:          Screening for colorectal malignant neoplasm Providers:            [name] MD, MD Referring MD:         Marliss Coots (Referring MD) Medicines:            Propofol per Anesthesia Complications:        No immediate complications. Procedure:            Pre-Anesthesia Assessment:                       - Prior to the procedure, a History and Physical was                        performed, and patient medications and allergies were                        reviewed. The patient's tolerance of previous                        anesthesia was also reviewed. The risks and benefits of                        the procedure and the sedation options and risks were                        discussed with the patient. All questions were                        answered, and informed consent was obtained. Prior                        Anticoagulants: The patient has taken no previous                        anticoagulant or antiplatelet agents. ASA Grade                        Assessment: II - A patient with mild systemic disease.                        After reviewing the risks and benefits, the patient was                        deemed in satisfactory condition to undergo the                        procedure.                       After obtaining informed consent, the colonoscope was                        passed under direct vision. Throughout the procedure,  the patient's blood pressure, pulse, and oxygen                        saturations were monitored continuously. The                        Colonoscope was introduced through the anus and      advanced to the the cecum, identified by appendiceal                        orifice and ileocecal valve. The colonoscopy was                        performed without difficulty. The patient tolerated the                        procedure well. The quality of the bowel preparation                        was excellent. Findings:      The perianal and digital rectal examinations were normal.      Two sessile polyps were found in the sigmoid colon. The polyps were 4 to       6 mm in size. These polyps were removed with a cold snare. Resection and       retrieval were complete. Impression:           - Two 4 to 6 mm polyps in the sigmoid colon, removed                        with a cold snare. Resected and retrieved. Recommendation:       - Discharge patient to home.                       - Resume previous diet.                       - Continue present medications.                       - Await pathology results.                       - Repeat colonoscopy in 5 years if polyp adenoma and 10                        years if hyperplastic Procedure Code(s):    --- Professional ---                       [phone], Colonoscopy, flexible; with removal of tumor(s),                        polyp(s), or other lesion(s) by snare technique Diagnosis Code(s):    --- Professional ---                       Z12.11, Encounter for screening for malignant neoplasm                        of colon  K63.5, Polyp of colon CPT copyright 2019 American Medical Association. All rights reserved. The codes documented in this report are preliminary and upon coder review may  be revised to meet current compliance requirements. [name] MD, MD 10/04/2018 10:08:13 AM This report has been signed electronically. Number of Addenda: 0 Note Initiated On: 10/04/2018 9:38 AM Scope Withdrawal Time: 0 hours 9 minutes 21 seconds  Total Procedure Duration: 0 hours 12 minutes 26 seconds       [facility]

## 2018-10-04 NOTE — Transfer of Care (Signed)
Immediate Anesthesia Transfer of Care Note  Patient: Richard Bauer  Procedure(s) Performed: COLONOSCOPY WITH PROPOFOL (N/A )  Patient Location: Endoscopy Unit  Anesthesia Type:General  Level of Consciousness: drowsy and responds to stimulation  Airway & Oxygen Therapy: Patient Spontanous Breathing and Patient connected to face mask oxygen  Post-op Assessment: Report given to RN and Post -op Vital signs reviewed and stable  Post vital signs: Reviewed and stable  Last Vitals:  Vitals Value Taken Time  BP 100/60 10/04/18 1005  Temp 36.1 C 10/04/18 1005  Pulse 94 10/04/18 1008  Resp 17 10/04/18 1008  SpO2 100 % 10/04/18 1008  Vitals shown include unvalidated device data.  Last Pain:  Vitals:   10/04/18 1005  TempSrc: Tympanic  PainSc: Asleep         Complications: No apparent anesthesia complications

## 2018-10-04 NOTE — Anesthesia Post-op Follow-up Note (Signed)
Anesthesia QCDR form completed.        

## 2018-10-04 NOTE — Anesthesia Postprocedure Evaluation (Signed)
Anesthesia Post Note  Patient: Richard Bauer  Procedure(s) Performed: COLONOSCOPY WITH PROPOFOL (N/A )  Patient location during evaluation: Endoscopy Anesthesia Type: General Level of consciousness: awake and alert Pain management: pain level controlled Vital Signs Assessment: post-procedure vital signs reviewed and stable Respiratory status: spontaneous breathing, nonlabored ventilation, respiratory function stable and patient connected to nasal cannula oxygen Cardiovascular status: blood pressure returned to baseline and stable Postop Assessment: no apparent nausea or vomiting Anesthetic complications: no     Last Vitals:  Vitals:   10/04/18 1025 10/04/18 1035  BP: 104/72 99/81  Pulse: 60 82  Resp: 18 17  Temp:    SpO2: 100% 100%    Last Pain:  Vitals:   10/04/18 1035  TempSrc:   PainSc: 0-No pain                 Rainn Zupko S
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
## 2018-10-04 NOTE — H&P (Signed)
Richard Lame, MD [address] [name], [place] [address] Phone: [phone] Fax : [phone]  Primary Care Physician:  Richard Coots, NP Primary Gastroenterologist:  Richard Bauer  Pre-Procedure History & Physical: HPI:  Richard Bauer is a 58 y.o. male is here for a screening colonoscopy.   Past Medical History:  Diagnosis Date  . Arthritis    left hip  . Carpal tunnel syndrome, bilateral   . Hep C w/o coma, chronic (HCC)     Past Surgical History:  Procedure Laterality Date  . ORIF MANDIBULAR FRACTURE N/A 09/29/2015   Procedure: OPEN REDUCTION INTERNAL FIXATION (ORIF) MANDIBULAR FRACTURE;  Surgeon: Richard Belfast, MD;  Location: [place] [facility];  Service: ENT;  Laterality: N/A;    Prior to Admission medications   Not on File    Allergies as of 09/13/2018 - Review Complete 09/05/2018  Allergen Reaction Noted  . Ibuprofen Nausea And Vomiting and Swelling 11/03/2012    Family History  Family history unknown: Yes    Social History   Socioeconomic History  . Marital status: Single    Spouse name: Not on file  . Number of children: Not on file  . Years of education: Not on file  . Highest education level: Not on file  Occupational History  . Not on file  Social Needs  . Financial resource strain: Not on file  . Food insecurity    Worry: Not on file    Inability: Not on file  . Transportation needs    Medical: Not on file    Non-medical: Not on file  Tobacco Use  . Smoking status: Current Some Day Smoker    Types: Cigarettes  . Smokeless tobacco: Never Used  . Tobacco comment: sokes about 1 pack/week, off and on since age 68  Substance and Sexual Activity  . Alcohol use: No  . Drug use: No  . Sexual activity: Not on file  Lifestyle  . Physical activity    Days per week: Not on file    Minutes per session: Not on file  . Stress: Not on file  Relationships  . Social Herbalist on phone: Not on file    Gets together: Not on file   Attends religious service: Not on file    Active member of club or organization: Not on file    Attends meetings of clubs or organizations: Not on file    Relationship status: Not on file  . Intimate partner violence    Fear of current or ex partner: Not on file    Emotionally abused: Not on file    Physically abused: Not on file    Forced sexual activity: Not on file  Other Topics Concern  . Not on file  Social History Narrative  . Not on file    Review of Systems: See HPI, otherwise negative ROS  Physical Exam: There were no vitals taken for this visit. General:   Alert,  pleasant and cooperative in NAD Head:  Normocephalic and atraumatic. Neck:  Supple; no masses or thyromegaly. Lungs:  Clear throughout to auscultation.    Heart:  Regular rate and rhythm. Abdomen:  Soft, nontender and nondistended. Normal bowel sounds, without guarding, and without rebound.   Neurologic:  Alert and  oriented x4;  grossly normal neurologically.  Impression/Plan: Richard Bauer is now here to undergo a screening colonoscopy.  Risks, benefits, and alternatives regarding colonoscopy have been reviewed with the patient.  Questions have been answered.  All parties agreeable.

## 2018-10-05 ENCOUNTER — Encounter: Payer: Self-pay | Admitting: Gastroenterology

## 2018-10-05 LAB — SURGICAL PATHOLOGY

## 2018-10-06 ENCOUNTER — Encounter: Payer: Self-pay | Admitting: Gastroenterology

## 2018-10-20 ENCOUNTER — Ambulatory Visit: Payer: Self-pay | Admitting: Gerontology

## 2018-10-25 ENCOUNTER — Other Ambulatory Visit: Payer: Self-pay

## 2018-10-25 ENCOUNTER — Telehealth: Payer: Self-pay | Admitting: Gastroenterology

## 2018-10-25 ENCOUNTER — Ambulatory Visit: Payer: Self-pay | Admitting: Gerontology

## 2018-10-25 ENCOUNTER — Encounter: Payer: Self-pay | Admitting: Gerontology

## 2018-10-25 DIAGNOSIS — IMO0001 Reserved for inherently not codable concepts without codable children: Secondary | ICD-10-CM | POA: Insufficient documentation

## 2018-10-25 DIAGNOSIS — F17200 Nicotine dependence, unspecified, uncomplicated: Secondary | ICD-10-CM | POA: Insufficient documentation

## 2018-10-25 DIAGNOSIS — B182 Chronic viral hepatitis C: Secondary | ICD-10-CM

## 2018-10-25 DIAGNOSIS — R748 Abnormal levels of other serum enzymes: Secondary | ICD-10-CM

## 2018-10-25 DIAGNOSIS — K739 Chronic hepatitis, unspecified: Secondary | ICD-10-CM | POA: Insufficient documentation

## 2018-10-25 NOTE — Progress Notes (Signed)
Established Patient Office Visit  Subjective:  Patient ID: Richard Bauer, male    DOB: [birth year]  Age: 58 y.o. MRN: [id number]  CC: No chief complaint on file. Patient consents to telephone visit and 2 patient identifiers was used to identify patient.  HPI Richard Bauer presents for follow up of elevated liver enzymes and Hep C. He was evaluated by Dr Allen Norris on 09/01/18 for Hepatitis C and colonoscopy was done on 10/04/18. Two 4 to 6 mm polyps in the sigmoid colon was removed with a cold snare, and was negative for dysplasia and malignancy. He continues to smoke 1 pack of cigarette a week and admits the desire to quit. He denies chest pain, palpitation, dizziness, abdominal pain, fever, chills and no further concerns.  Past Medical History:  Diagnosis Date  . Arthritis    left hip  . Carpal tunnel syndrome, bilateral   . Hep C w/o coma, chronic (HCC)     Past Surgical History:  Procedure Laterality Date  . COLONOSCOPY WITH PROPOFOL N/A 10/04/2018   Procedure: COLONOSCOPY WITH PROPOFOL;  Surgeon: [name], MD;  Location: [facility] ENDOSCOPY;  Service: Endoscopy;  Laterality: N/A;  . ORIF MANDIBULAR FRACTURE N/A 09/29/2015   Procedure: OPEN REDUCTION INTERNAL FIXATION (ORIF) MANDIBULAR FRACTURE;  Surgeon: Jerrell Belfast, MD;  Location: [facility] OR;  Service: ENT;  Laterality: N/A;    Family History  Family history unknown: Yes    Social History   Socioeconomic History  . Marital status: Single    Spouse name: Not on file  . Number of children: Not on file  . Years of education: Not on file  . Highest education level: Not on file  Occupational History  . Not on file  Social Needs  . Financial resource strain: Very hard  . Food insecurity    Worry: Often true    Inability: Often true  . Transportation needs    Medical: Yes    Non-medical: Yes  Tobacco Use  . Smoking status: Current Some Day Smoker    Packs/day: 0.25    Types: Cigarettes  . Smokeless tobacco: Never Used  .  Tobacco comment: sokes about 1 pack/week, off and on since age 72  Substance and Sexual Activity  . Alcohol use: No  . Drug use: No  . Sexual activity: Yes  Lifestyle  . Physical activity    Days per week: 7 days    Minutes per session: 60 min  . Stress: Not at all  Relationships  . Social Herbalist on phone: Three times a week    Gets together: Three times a week    Attends religious service: More than 4 times per year    Active member of club or organization: Yes    Attends meetings of clubs or organizations: More than 4 times per year    Relationship status: Married  . Intimate partner violence    Fear of current or ex partner: Not on file    Emotionally abused: No    Physically abused: No    Forced sexual activity: No  Other Topics Concern  . Not on file  Social History Narrative  . Not on file    No outpatient medications prior to visit.   No facility-administered medications prior to visit.     Allergies  Allergen Reactions  . Ibuprofen Nausea And Vomiting and Swelling    ROS Review of Systems  Constitutional: Negative.   Respiratory: Negative.   Cardiovascular: Negative.  Gastrointestinal: Negative.   Genitourinary: Negative.   Musculoskeletal: Negative.   Skin: Negative.   Neurological: Negative.   Psychiatric/Behavioral: Negative.       Objective:    Physical Exam No vital sign or PE was done. Ht 5\' 9"  (1.753 m)   Wt 178 lb (80.7 kg)   BMI 26.29 kg/m  Wt Readings from Last 3 Encounters:  10/25/18 178 lb (80.7 kg)  10/04/18 170 lb (77.1 kg)  09/01/18 170 lb 3.2 oz (77.2 kg)     Health Maintenance Due  Topic Date Due  . HIV Screening  08/19/1977  . TETANUS/TDAP  08/19/1981    There are no preventive care reminders to display for this patient.  Lab Results  Component Value Date   TSH 0.754 08/17/2018   Lab Results  Component Value Date   WBC 4.8 08/17/2018   HGB 15.3 08/17/2018   HCT 45.1 08/17/2018   MCV 95  08/17/2018   PLT 276 08/17/2018   Lab Results  Component Value Date   NA 137 08/17/2018   K 4.2 08/17/2018   CO2 23 08/17/2018   GLUCOSE 108 (H) 08/17/2018   BUN 12 08/17/2018   CREATININE 1.07 08/17/2018   BILITOT 0.9 09/01/2018   ALKPHOS 62 09/01/2018   AST 66 (H) 09/01/2018   ALT 122 (H) 09/01/2018   PROT 8.2 (H) 09/01/2018   ALBUMIN 4.3 09/01/2018   CALCIUM 9.8 08/17/2018   ANIONGAP 6 09/29/2015   Lab Results  Component Value Date   CHOL 188 08/17/2018   Lab Results  Component Value Date   HDL 52 08/17/2018   Lab Results  Component Value Date   LDLCALC 108 (H) 08/17/2018   Lab Results  Component Value Date   TRIG 139 08/17/2018   Lab Results  Component Value Date   CHOLHDL 3.6 08/17/2018   Lab Results  Component Value Date   HGBA1C 5.3 08/17/2018      Assessment & Plan:     1. Elevated liver enzymes - He will continue to follow up with Dr Allen Norris.  2. Chronic hepatitis ([place]) - He will follow up with Dr Allen Norris  3. Smoking - He was strongly encouraged on smoking cessation.   Follow-up: Return in about 13 weeks (around 01/24/2019), or if symptoms worsen or fail to improve.    Lovely Kerins Jerold Coombe, NP

## 2018-10-25 NOTE — Telephone Encounter (Signed)
Pt is calling to check on Lab results for the Hepatitis to see what the outcome was

## 2018-10-25 NOTE — Telephone Encounter (Signed)
Advised pt he was given lab results on 09/07/18. Advised him also he was a no show to his ultrasound that was scheduled for 09/13/18. Ordered a Fibrosure lab and pt will get transportation to take him to have this done at [facility]. Pt will start Hep C treatment once this lab is back.

## 2018-11-01 ENCOUNTER — Institutional Professional Consult (permissible substitution): Payer: Self-pay | Admitting: Licensed Clinical Social Worker

## 2018-11-15 ENCOUNTER — Telehealth: Payer: Self-pay | Admitting: Gastroenterology

## 2018-11-15 NOTE — Telephone Encounter (Signed)
Pt left vm [facility] and he has not been given a date for that apt please call pt

## 2018-11-16 NOTE — Telephone Encounter (Signed)
Left vm informing pt he does not need an appt to get labs done at [facility]

## 2018-11-30 ENCOUNTER — Other Ambulatory Visit
Admission: RE | Admit: 2018-11-30 | Discharge: 2018-11-30 | Disposition: A | Discharge status: Home / Self Care | Payer: Self-pay | Source: Ambulatory Visit | Attending: Gastroenterology | Admitting: Gastroenterology

## 2018-11-30 ENCOUNTER — Other Ambulatory Visit: Payer: Self-pay

## 2018-11-30 DIAGNOSIS — B182 Chronic viral hepatitis C: Secondary | ICD-10-CM | POA: Insufficient documentation

## 2018-12-02 LAB — HCV FIBROSURE
ALPHA 2-MACROGLOBULINS, QN: 352 mg/dL — ABNORMAL HIGH (ref 110–276)
ALT (SGPT) P5P: 57 IU/L — ABNORMAL HIGH (ref 0–55)
Apolipoprotein A-1: 141 mg/dL (ref 101–178)
Bilirubin, Total: 1.1 mg/dL (ref 0.0–1.2)
Fibrosis Score: 0.71 — ABNORMAL HIGH (ref 0.00–0.21)
GGT: 41 IU/L (ref 0–65)
Haptoglobin: 115 mg/dL (ref 29–370)
Necroinflammat Activity Score: 0.49 — ABNORMAL HIGH (ref 0.00–0.17)

## 2018-12-08 ENCOUNTER — Telehealth: Payer: Self-pay | Admitting: Gastroenterology

## 2018-12-08 ENCOUNTER — Other Ambulatory Visit: Payer: Self-pay

## 2018-12-08 NOTE — Telephone Encounter (Signed)
LVM for pt to return my call.

## 2018-12-08 NOTE — Telephone Encounter (Signed)
Pt is calling regarding his Lab results and to see what medication he needs to be on

## 2018-12-12 ENCOUNTER — Telehealth: Payer: Self-pay

## 2018-12-12 NOTE — Telephone Encounter (Signed)
Pt notified of results

## 2018-12-12 NOTE — Telephone Encounter (Signed)
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 Thanks.

## 2019-01-19 ENCOUNTER — Telehealth: Payer: Self-pay | Admitting: Gerontology

## 2019-01-19 NOTE — Telephone Encounter (Signed)
Called 10/22 about 10/27 appt. Pt denies symptoms and was screened. Pt aware of in-clinic visit.

## 2019-01-24 ENCOUNTER — Ambulatory Visit: Payer: Self-pay | Admitting: Gerontology

## 2019-01-24 ENCOUNTER — Encounter: Payer: Self-pay | Admitting: Gerontology

## 2019-01-24 ENCOUNTER — Other Ambulatory Visit: Payer: Self-pay

## 2019-01-24 DIAGNOSIS — Z0000 Encounter for general adult medical examination without abnormal findings: Secondary | ICD-10-CM

## 2019-01-24 DIAGNOSIS — F17200 Nicotine dependence, unspecified, uncomplicated: Secondary | ICD-10-CM

## 2019-01-24 DIAGNOSIS — K739 Chronic hepatitis, unspecified: Secondary | ICD-10-CM

## 2019-01-24 NOTE — Progress Notes (Signed)
Established Patient Office Visit  Subjective:  Patient ID: Richard Bauer, male    DOB: 18-Jun-1960  Age: 58 y.o. MRN: [id number]  CC:  Chief Complaint  Patient presents with  . Hepatitis C  Patient consents to telephone visit and 2 patient identifiers was used to identify patient.  HPI Richard Bauer presents for follow up of Chronic Hep C. He reports that he has not started the Hep C treatment and will follow up with Dr Richard Bauer. He continues to reside at [facility] for rehabilitation and reports smoking 4 cigarettes daily and admits the desire to quit. He denies chest pain, palpitation, light headedness, abdominal pain, fever and chills. He reports that he's doing well and offers no further complaint.  Past Medical History:  Diagnosis Date  . Arthritis    left hip  . Carpal tunnel syndrome, bilateral   . Hep C w/o coma, chronic (HCC)     Past Surgical History:  Procedure Laterality Date  . COLONOSCOPY WITH PROPOFOL N/A 10/04/2018   Procedure: COLONOSCOPY WITH PROPOFOL;  Surgeon: [name], MD;  Location: [facility] ENDOSCOPY;  Service: Endoscopy;  Laterality: N/A;  . ORIF MANDIBULAR FRACTURE N/A 09/29/2015   Procedure: OPEN REDUCTION INTERNAL FIXATION (ORIF) MANDIBULAR FRACTURE;  Surgeon: Jerrell Belfast, MD;  Location: [facility] OR;  Service: ENT;  Laterality: N/A;    Family History  Family history unknown: Yes    Social History   Socioeconomic History  . Marital status: Single    Spouse name: Not on file  . Number of children: Not on file  . Years of education: Not on file  . Highest education level: Not on file  Occupational History  . Not on file  Social Needs  . Financial resource strain: Very hard  . Food insecurity    Worry: Often true    Inability: Often true  . Transportation needs    Medical: Yes    Non-medical: Yes  Tobacco Use  . Smoking status: Current Some Day Smoker    Packs/day: 0.25    Types: Cigarettes  . Smokeless tobacco: Never Used  . Tobacco  comment: sokes about 1 pack/week, off and on since age 75  Substance and Sexual Activity  . Alcohol use: No  . Drug use: No  . Sexual activity: Yes  Lifestyle  . Physical activity    Days per week: 7 days    Minutes per session: 60 min  . Stress: Not at all  Relationships  . Social Herbalist on phone: Three times a week    Gets together: Three times a week    Attends religious service: More than 4 times per year    Active member of club or organization: Yes    Attends meetings of clubs or organizations: More than 4 times per year    Relationship status: Married  . Intimate partner violence    Fear of current or ex partner: Not on file    Emotionally abused: No    Physically abused: No    Forced sexual activity: No  Other Topics Concern  . Not on file  Social History Narrative  . Not on file    No outpatient medications prior to visit.   No facility-administered medications prior to visit.     Allergies  Allergen Reactions  . Ibuprofen Nausea And Vomiting and Swelling    ROS Review of Systems  Constitutional: Negative.   Respiratory: Negative.   Cardiovascular: Negative.   Gastrointestinal: Negative.  Genitourinary: Negative.   Musculoskeletal: Negative.   Skin: Negative.   Neurological: Negative.   Psychiatric/Behavioral: Negative.       Objective:    Physical Exam No PE was done There were no vitals taken for this visit. Wt Readings from Last 3 Encounters:  10/25/18 178 lb (80.7 kg)  10/04/18 170 lb (77.1 kg)  09/01/18 170 lb 3.2 oz (77.2 kg)     Health Maintenance Due  Topic Date Due  . HIV Screening  08/19/1977  . TETANUS/TDAP  08/19/1981    There are no preventive care reminders to display for this patient.  Lab Results  Component Value Date   TSH 0.754 08/17/2018   Lab Results  Component Value Date   WBC 4.8 08/17/2018   HGB 15.3 08/17/2018   HCT 45.1 08/17/2018   MCV 95 08/17/2018   PLT 276 08/17/2018   Lab Results   Component Value Date   NA 137 08/17/2018   K 4.2 08/17/2018   CO2 23 08/17/2018   GLUCOSE 108 (H) 08/17/2018   BUN 12 08/17/2018   CREATININE 1.07 08/17/2018   BILITOT 0.9 09/01/2018   ALKPHOS 62 09/01/2018   AST 66 (H) 09/01/2018   ALT 122 (H) 09/01/2018   PROT 8.2 (H) 09/01/2018   ALBUMIN 4.3 09/01/2018   CALCIUM 9.8 08/17/2018   ANIONGAP 6 09/29/2015   Lab Results  Component Value Date   CHOL 188 08/17/2018   Lab Results  Component Value Date   HDL 52 08/17/2018   Lab Results  Component Value Date   LDLCALC 108 (H) 08/17/2018   Lab Results  Component Value Date   TRIG 139 08/17/2018   Lab Results  Component Value Date   CHOLHDL 3.6 08/17/2018   Lab Results  Component Value Date   HGBA1C 5.3 08/17/2018      Assessment & Plan:     1. Chronic hepatitis (Richard Bauer) - He was advised to follow up with Dr Richard Bauer for his Hep C treatment.  2. Smoking - He was strongly encouraged on smoking cessation.  3. Health care maintenance - Routine lab will be checked. - Comp Met (CMET); Future - HgB A1c; Future - Lipid panel; Future - Urinalysis; Future   Follow-up: Return in about 6 months (around 07/25/2019), or if symptoms worsen or fail to improve.    Chistine Dematteo Jerold Coombe, NP

## 2019-03-13 ENCOUNTER — Ambulatory Visit: Payer: Self-pay | Admitting: Gerontology

## 2019-03-13 ENCOUNTER — Other Ambulatory Visit: Payer: Self-pay

## 2019-03-13 DIAGNOSIS — F17200 Nicotine dependence, unspecified, uncomplicated: Secondary | ICD-10-CM

## 2019-03-13 DIAGNOSIS — K739 Chronic hepatitis, unspecified: Secondary | ICD-10-CM

## 2019-03-13 NOTE — Patient Instructions (Signed)

## 2019-03-13 NOTE — Progress Notes (Signed)
Established Patient Office Visit  Subjective:  Patient ID: Richard Bauer, male    DOB: 04-02-1960  Age: 58 y.o. MRN: [id number]  CC: No chief complaint on file. Patient consents to telephone visit, and 2 patient identifiers was used to identify patient.  HPI Richard Bauer presents for follow-up visit.  He states that he is yet to contact Dr. Allen Norris  D for his hepatitis C treatment.  On 12/06/2018, Dr. Allen Norris D stated that he needs to start his hep C treatment since he has F3 fibrosis.  He denies right upper quadrant abdominal pain and jaundice.  He states that he continues to smoke 3 to 5 cigarettes daily and admits to desire to quit.  He denies chest pain, palpitation, lightheadedness, fever and chills.  He states that he is doing well on all fives no further complaints.  Past Medical History:  Diagnosis Date  . Arthritis    left hip  . Carpal tunnel syndrome, bilateral   . Hep C w/o coma, chronic (HCC)     Past Surgical History:  Procedure Laterality Date  . COLONOSCOPY WITH PROPOFOL N/A 10/04/2018   Procedure: COLONOSCOPY WITH PROPOFOL;  Surgeon: [name], MD;  Location: [facility] ENDOSCOPY;  Service: Endoscopy;  Laterality: N/A;  . ORIF MANDIBULAR FRACTURE N/A 09/29/2015   Procedure: OPEN REDUCTION INTERNAL FIXATION (ORIF) MANDIBULAR FRACTURE;  Surgeon: Jerrell Belfast, MD;  Location: [facility] OR;  Service: ENT;  Laterality: N/A;    Family History  Family history unknown: Yes    Social History   Socioeconomic History  . Marital status: Single    Spouse name: Not on file  . Number of children: Not on file  . Years of education: Not on file  . Highest education level: Not on file  Occupational History  . Not on file  Tobacco Use  . Smoking status: Current Some Day Smoker    Packs/day: 0.25    Types: Cigarettes  . Smokeless tobacco: Never Used  . Tobacco comment: sokes about 1 pack/week, off and on since age 24  Substance and Sexual Activity  . Alcohol use: No  . Drug use: No   . Sexual activity: Yes  Other Topics Concern  . Not on file  Social History Narrative  . Not on file   Social Determinants of Health   Financial Resource Strain: High Risk  . Difficulty of Paying Living Expenses: Very hard  Food Insecurity: Food Insecurity Present  . Worried About Charity fundraiser in the Last Year: Often true  . Ran Out of Food in the Last Year: Often true  Transportation Needs: Unmet Transportation Needs  . Lack of Transportation (Medical): Yes  . Lack of Transportation (Non-Medical): Yes  Physical Activity: Sufficiently Active  . Days of Exercise per Week: 7 days  . Minutes of Exercise per Session: 60 min  Stress: No Stress Concern Present  . Feeling of Stress : Not at all  Social Connections: Not Isolated  . Frequency of Communication with Friends and Family: Three times a week  . Frequency of Social Gatherings with Friends and Family: Three times a week  . Attends Religious Services: More than 4 times per year  . Active Member of Clubs or Organizations: Yes  . Attends Archivist Meetings: More than 4 times per year  . Marital Status: Married  Human resources officer Violence: Unknown  . Fear of Current or Ex-Partner: Not asked  . Emotionally Abused: No  . Physically Abused: No  . Sexually Abused:  No    No outpatient medications prior to visit.   No facility-administered medications prior to visit.    Allergies  Allergen Reactions  . Ibuprofen Nausea And Vomiting and Swelling    ROS Review of Systems  Constitutional: Negative.   Respiratory: Negative.   Cardiovascular: Negative.   Gastrointestinal: Negative.   Skin: Negative.   Neurological: Negative.   Psychiatric/Behavioral: Negative.       Objective:    Physical Exam No PE done There were no vitals taken for this visit. Wt Readings from Last 3 Encounters:  10/25/18 178 lb (80.7 kg)  10/04/18 170 lb (77.1 kg)  09/01/18 170 lb 3.2 oz (77.2 kg)     Health Maintenance Due   Topic Date Due  . HIV Screening  08/19/1977  . TETANUS/TDAP  08/19/1981    There are no preventive care reminders to display for this patient.  Lab Results  Component Value Date   TSH 0.754 08/17/2018   Lab Results  Component Value Date   WBC 4.8 08/17/2018   HGB 15.3 08/17/2018   HCT 45.1 08/17/2018   MCV 95 08/17/2018   PLT 276 08/17/2018   Lab Results  Component Value Date   NA 137 08/17/2018   K 4.2 08/17/2018   CO2 23 08/17/2018   GLUCOSE 108 (H) 08/17/2018   BUN 12 08/17/2018   CREATININE 1.07 08/17/2018   BILITOT 0.9 09/01/2018   ALKPHOS 62 09/01/2018   AST 66 (H) 09/01/2018   ALT 122 (H) 09/01/2018   PROT 8.2 (H) 09/01/2018   ALBUMIN 4.3 09/01/2018   CALCIUM 9.8 08/17/2018   ANIONGAP 6 09/29/2015   Lab Results  Component Value Date   CHOL 188 08/17/2018   Lab Results  Component Value Date   HDL 52 08/17/2018   Lab Results  Component Value Date   LDLCALC 108 (H) 08/17/2018   Lab Results  Component Value Date   TRIG 139 08/17/2018   Lab Results  Component Value Date   CHOLHDL 3.6 08/17/2018   Lab Results  Component Value Date   HGBA1C 5.3 08/17/2018      Assessment & Plan:   1. Chronic hepatitis ([place]) -He was encouraged to call and schedule an appointment with - Ambulatory referral to Gastroenterology Dr. Allen Norris D to start his hepatitis C treatment.  2. Smoking -He was encouraged on smoking cessation and was provided with [place] quit line number.    Follow-up: Return in about 6 weeks (around 04/27/2019), or if symptoms worsen or fail to improve.    Erikson Danzy Jerold Coombe, NP

## 2019-04-01 ENCOUNTER — Encounter (HOSPITAL_COMMUNITY): Payer: Self-pay | Admitting: Emergency Medicine

## 2019-04-01 ENCOUNTER — Emergency Department (HOSPITAL_COMMUNITY): Payer: Self-pay

## 2019-04-01 ENCOUNTER — Emergency Department (HOSPITAL_COMMUNITY)
Admission: EM | Admit: 2019-04-01 | Discharge: 2019-04-01 | Disposition: A | Discharge status: Home / Self Care | Payer: Self-pay | Attending: Emergency Medicine | Admitting: Emergency Medicine

## 2019-04-01 ENCOUNTER — Other Ambulatory Visit: Payer: Self-pay

## 2019-04-01 DIAGNOSIS — R52 Pain, unspecified: Secondary | ICD-10-CM

## 2019-04-01 DIAGNOSIS — F17210 Nicotine dependence, cigarettes, uncomplicated: Secondary | ICD-10-CM | POA: Insufficient documentation

## 2019-04-01 DIAGNOSIS — M5432 Sciatica, left side: Secondary | ICD-10-CM | POA: Insufficient documentation

## 2019-04-01 MED ORDER — DEXAMETHASONE SODIUM PHOSPHATE 10 MG/ML IJ SOLN
8.0000 mg | Freq: Once | INTRAMUSCULAR | Status: AC
  Administered 2019-04-01: 12:00:00 8 mg via INTRAMUSCULAR
  Filled 2019-04-01: qty 1

## 2019-04-01 MED ORDER — METHOCARBAMOL 500 MG PO TABS
500.0000 mg | ORAL_TABLET | Freq: Once | ORAL | Status: AC
  Administered 2019-04-01: 500 mg via ORAL
  Filled 2019-04-01: qty 1

## 2019-04-01 MED ORDER — CYCLOBENZAPRINE HCL 10 MG PO TABS: 10.0000 mg | ORAL_TABLET | Freq: Two times a day (BID) | ORAL | 0 refills | Status: DC | PRN

## 2019-04-01 MED ORDER — HYDROCODONE-ACETAMINOPHEN 5-325 MG PO TABS
1.0000 | ORAL_TABLET | Freq: Once | ORAL | Status: AC
  Administered 2019-04-01: 1 via ORAL
  Filled 2019-04-01: qty 1

## 2019-04-01 NOTE — ED Notes (Signed)
Patient Alert and oriented to baseline. Stable and ambulatory to baseline. Patient verbalized understanding of the discharge instructions.  Patient belongings were taken by the patient.   

## 2019-04-01 NOTE — Discharge Instructions (Signed)
Please read instructions below.  You can take 600 mg of advil every 6 hours as needed for pain.   Apply heat to your back/hip to help with pain.   You can take Flexeril/cyclobenzaprine every 12 hours as needed for muscle spasm.  Be aware this medication can make you drowsy; do not take while driving or drinking alcohol.   Follow-up with your primary care provider symptoms persist.   Return to ER if new numbness or tingling in your arms or legs, inability to urinate, inability to hold your bowels, or weakness in your extremities.

## 2019-04-01 NOTE — ED Triage Notes (Signed)
C/o pain to L hip that radiates to L knee x 2 days.  Denies known injury.
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
## 2019-04-03 ENCOUNTER — Other Ambulatory Visit: Payer: Self-pay

## 2019-04-03 ENCOUNTER — Emergency Department (HOSPITAL_COMMUNITY)
Admission: EM | Admit: 2019-04-03 | Discharge: 2019-04-03 | Disposition: A | Discharge status: Home / Self Care | Payer: Self-pay | Attending: Emergency Medicine | Admitting: Emergency Medicine

## 2019-04-03 ENCOUNTER — Encounter (HOSPITAL_COMMUNITY): Payer: Self-pay | Admitting: *Deleted

## 2019-04-03 DIAGNOSIS — B029 Zoster without complications: Secondary | ICD-10-CM | POA: Insufficient documentation

## 2019-04-03 DIAGNOSIS — F17200 Nicotine dependence, unspecified, uncomplicated: Secondary | ICD-10-CM | POA: Insufficient documentation

## 2019-04-03 MED ORDER — PREDNISONE 20 MG PO TABS
60.0000 mg | ORAL_TABLET | Freq: Once | ORAL | Status: AC
  Administered 2019-04-03: 60 mg via ORAL
  Filled 2019-04-03: qty 3

## 2019-04-03 MED ORDER — PREDNISONE 20 MG PO TABS: ORAL_TABLET | ORAL | 0 refills | Status: DC

## 2019-04-03 MED ORDER — VALACYCLOVIR HCL 1 G PO TABS: 1000.0000 mg | ORAL_TABLET | Freq: Three times a day (TID) | ORAL | 0 refills | Status: DC

## 2019-04-03 MED ORDER — HYDROCODONE-ACETAMINOPHEN 5-325 MG PO TABS
2.0000 | ORAL_TABLET | Freq: Once | ORAL | Status: AC
  Administered 2019-04-03: 10:00:00 2 via ORAL
  Filled 2019-04-03: qty 2

## 2019-04-03 NOTE — Progress Notes (Signed)
Orthopedic Tech Progress Note Patient Details:  Richard Bauer December 05, 1960 [id number]  Ortho Devices Type of Ortho Device: Crutches Ortho Device/Splint Interventions: Application   Post Interventions Patient Tolerated: Well Instructions Provided: Care of device   [place] Pink 04/03/2019, 10:02 AM

## 2019-04-03 NOTE — ED Triage Notes (Signed)
Pt reports having sciatica, was seen here recently for same and no relief with meds prescribed.
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
## 2019-04-03 NOTE — ED Notes (Signed)
Pt has a round area of red raised skin that appears to be inflamed externally and maybe internally on his lower back left of sacral area.

## 2019-04-03 NOTE — Discharge Instructions (Addendum)
Use crutches as needed for support and pain.  Watch for signs of infection such as fevers, spreading redness, vomiting.  Follow-up with your doctor as needed.  Work note will be given.  Use tylenol, ice and motrin for pain. Take antiviral medication as prescribed.

## 2019-04-05 ENCOUNTER — Encounter: Payer: Self-pay | Admitting: *Deleted

## 2019-04-05 ENCOUNTER — Other Ambulatory Visit: Payer: Self-pay

## 2019-04-05 ENCOUNTER — Encounter (HOSPITAL_COMMUNITY): Payer: Self-pay

## 2019-04-05 ENCOUNTER — Emergency Department (HOSPITAL_COMMUNITY)
Admission: EM | Admit: 2019-04-05 | Discharge: 2019-04-05 | Disposition: A | Discharge status: Home / Self Care | Payer: Self-pay | Attending: Emergency Medicine | Admitting: Emergency Medicine

## 2019-04-05 DIAGNOSIS — F17210 Nicotine dependence, cigarettes, uncomplicated: Secondary | ICD-10-CM | POA: Insufficient documentation

## 2019-04-05 DIAGNOSIS — B029 Zoster without complications: Secondary | ICD-10-CM | POA: Insufficient documentation

## 2019-04-05 MED ORDER — MORPHINE SULFATE (PF) 4 MG/ML IV SOLN
4.0000 mg | Freq: Once | INTRAVENOUS | Status: AC
  Administered 2019-04-05: 4 mg via INTRAMUSCULAR
  Filled 2019-04-05: qty 1

## 2019-04-05 MED ORDER — HYDROCODONE-ACETAMINOPHEN 5-325 MG PO TABS: 1.0000 | ORAL_TABLET | Freq: Four times a day (QID) | ORAL | 0 refills | Status: AC | PRN

## 2019-04-05 MED ORDER — VALACYCLOVIR HCL 500 MG PO TABS
1000.0000 mg | ORAL_TABLET | Freq: Once | ORAL | Status: AC
  Administered 2019-04-05: 1000 mg via ORAL
  Filled 2019-04-05: qty 2

## 2019-04-05 MED ORDER — VALACYCLOVIR HCL 1 G PO TABS: 1000.0000 mg | ORAL_TABLET | Freq: Three times a day (TID) | ORAL | 0 refills | Status: AC

## 2019-04-05 NOTE — ED Triage Notes (Signed)
Pt here for shingles on left lower back extending down left leg.  Meds not working.  Pain unbearable.
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
## 2019-04-05 NOTE — ED Notes (Signed)
Pt verbalized understanding of discharge instructions. Pt had no further questions

## 2019-04-05 NOTE — Discharge Instructions (Signed)
Thank you for allowing me to care for you today. Please return to the emergency department if you have new or worsening symptoms. Take your medications as instructed.  ° °

## 2019-04-10 ENCOUNTER — Ambulatory Visit: Payer: Self-pay | Attending: Internal Medicine

## 2019-04-10 DIAGNOSIS — Z20822 Contact with and (suspected) exposure to covid-19: Secondary | ICD-10-CM | POA: Insufficient documentation

## 2019-04-11 ENCOUNTER — Ambulatory Visit: Payer: Self-pay | Admitting: Gerontology

## 2019-04-11 DIAGNOSIS — K739 Chronic hepatitis, unspecified: Secondary | ICD-10-CM

## 2019-04-11 DIAGNOSIS — R200 Anesthesia of skin: Secondary | ICD-10-CM

## 2019-04-11 LAB — NOVEL CORONAVIRUS, NAA: SARS-CoV-2, NAA: NOT DETECTED

## 2019-04-11 MED ORDER — GABAPENTIN 100 MG PO CAPS: 100.0000 mg | ORAL_CAPSULE | Freq: Three times a day (TID) | ORAL | 0 refills | Status: AC

## 2019-04-11 NOTE — Progress Notes (Signed)
Established Patient Office Visit  Subjective:  Patient ID: Richard Bauer, male    DOB: 1960-12-27  Age: 59 y.o. MRN: [id number]  CC:  Chief Complaint  Patient presents with  . Hepatitis  . Back Pain    started about 1 week ago  Patient consents to telephone visit and 2 patient identifiers was used to identify patient.  HPI Richard Bauer presents for complaint of numbness and tingling to his left thigh and leg that has been going on after he was diagnosed with herpes zoster.  He was seen at the emergency department at [facility] on 04/05/2019 for vesicular rash on lumbosacral dermatome of the left lower lumbar and sacral area extending to his posterior buttocks and thighs.  He continues to take 1000 mg of Valtrex 3 times daily, he states that the rash has dried up, but he continues to experience intermittent numbness and tingling to his left thigh and leg.  He denies any bladder or bowel incontinence, fever, chills and motor weakness.  He reports that he is doing well and offers no further complaints.  Past Medical History:  Diagnosis Date  . Arthritis    left hip  . Carpal tunnel syndrome, bilateral   . Hep C w/o coma, chronic (HCC)     Past Surgical History:  Procedure Laterality Date  . COLONOSCOPY WITH PROPOFOL N/A 10/04/2018   Procedure: COLONOSCOPY WITH PROPOFOL;  Surgeon: [name], MD;  Location: [facility] ENDOSCOPY;  Service: Endoscopy;  Laterality: N/A;  . ORIF MANDIBULAR FRACTURE N/A 09/29/2015   Procedure: OPEN REDUCTION INTERNAL FIXATION (ORIF) MANDIBULAR FRACTURE;  Surgeon: Jerrell Belfast, MD;  Location: [facility] OR;  Service: ENT;  Laterality: N/A;    Family History  Family history unknown: Yes    Social History   Socioeconomic History  . Marital status: Single    Spouse name: Not on file  . Number of children: Not on file  . Years of education: Not on file  . Highest education level: Not on file  Occupational History  . Not on file  Tobacco Use  .  Smoking status: Current Some Day Smoker    Packs/day: 0.25    Types: Cigarettes  . Smokeless tobacco: Never Used  . Tobacco comment: sokes about 1 pack/week, off and on since age 38  Substance and Sexual Activity  . Alcohol use: No  . Drug use: No  . Sexual activity: Yes  Other Topics Concern  . Not on file  Social History Narrative  . Not on file   Social Determinants of Health   Financial Resource Strain: High Risk  . Difficulty of Paying Living Expenses: Very hard  Food Insecurity: Food Insecurity Present  . Worried About Charity fundraiser in the Last Year: Often true  . Ran Out of Food in the Last Year: Often true  Transportation Needs: Unmet Transportation Needs  . Lack of Transportation (Medical): Yes  . Lack of Transportation (Non-Medical): Yes  Physical Activity: Sufficiently Active  . Days of Exercise per Week: 7 days  . Minutes of Exercise per Session: 60 min  Stress: No Stress Concern Present  . Feeling of Stress : Not at all  Social Connections: Not Isolated  . Frequency of Communication with Friends and Family: Three times a week  . Frequency of Social Gatherings with Friends and Family: Three times a week  . Attends Religious Services: More than 4 times per year  . Active Member of Clubs or Organizations: Yes  . Attends  Club or Organization Meetings: More than 4 times per year  . Marital Status: Married  Human resources officer Violence: Unknown  . Fear of Current or Ex-Partner: Not asked  . Emotionally Abused: No  . Physically Abused: No  . Sexually Abused: No    Outpatient Medications Prior to Visit  Medication Sig Dispense Refill  . valACYclovir (VALTREX) 1000 MG tablet Take 1 tablet (1,000 mg total) by mouth 3 (three) times daily. 21 tablet 0  . predniSONE (DELTASONE) 20 MG tablet 3 tabs po day one, then 2 po daily x 4 days 11 tablet 0  . cyclobenzaprine (FLEXERIL) 10 MG tablet Take 1 tablet (10 mg total) by mouth 2 (two) times daily as needed for muscle  spasms. (Patient not taking: Reported on 04/11/2019) 20 tablet 0   No facility-administered medications prior to visit.    Allergies  Allergen Reactions  . Ibuprofen Nausea And Vomiting and Swelling    ROS Review of Systems  Constitutional: Negative.   Respiratory: Negative.   Cardiovascular: Negative.   Skin: Positive for rash (herpes zoster to lumbarsacral and left thigh).  Neurological: Positive for numbness (to left thigh and leg).  Psychiatric/Behavioral: Negative.       Objective:    Physical Exam No Physical exam was done. There were no vitals taken for this visit. Wt Readings from Last 3 Encounters:  10/25/18 178 lb (80.7 kg)  10/04/18 170 lb (77.1 kg)  09/01/18 170 lb 3.2 oz (77.2 kg)     Health Maintenance Due  Topic Date Due  . HIV Screening  08/19/1977  . TETANUS/TDAP  08/19/1981    There are no preventive care reminders to display for this patient.  Lab Results  Component Value Date   TSH 0.754 08/17/2018   Lab Results  Component Value Date   WBC 4.8 08/17/2018   HGB 15.3 08/17/2018   HCT 45.1 08/17/2018   MCV 95 08/17/2018   PLT 276 08/17/2018   Lab Results  Component Value Date   NA 137 08/17/2018   K 4.2 08/17/2018   CO2 23 08/17/2018   GLUCOSE 108 (H) 08/17/2018   BUN 12 08/17/2018   CREATININE 1.07 08/17/2018   BILITOT 0.9 09/01/2018   ALKPHOS 62 09/01/2018   AST 66 (H) 09/01/2018   ALT 122 (H) 09/01/2018   PROT 8.2 (H) 09/01/2018   ALBUMIN 4.3 09/01/2018   CALCIUM 9.8 08/17/2018   ANIONGAP 6 09/29/2015   Lab Results  Component Value Date   CHOL 188 08/17/2018   Lab Results  Component Value Date   HDL 52 08/17/2018   Lab Results  Component Value Date   LDLCALC 108 (H) 08/17/2018   Lab Results  Component Value Date   TRIG 139 08/17/2018   Lab Results  Component Value Date   CHOLHDL 3.6 08/17/2018   Lab Results  Component Value Date   HGBA1C 5.3 08/17/2018      Assessment & Plan:    1. Chronic hepatitis  ([place]) -He was encouraged to follow-up with Dr. Allen Norris on 05/18/2019 for hepatitis C treatment evaluation.  2. Numbness and tingling of left leg -He will continue on gabapentin 100 mg 3 times daily for possible postherpetic neuralgia.  He was educated on medication side effect and advised to notify clinic.  He was advised to go to the ED for worsening symptoms. - gabapentin (NEURONTIN) 100 MG capsule; Take 1 capsule (100 mg total) by mouth 3 (three) times daily.  Dispense: 60 capsule; Refill: 0  Follow-up: Return in about 6 weeks (around 05/24/2019), or if symptoms worsen or fail to improve.    Holliday Sheaffer Jerold Coombe, NP

## 2019-04-12 ENCOUNTER — Telehealth: Payer: Self-pay

## 2019-04-12 ENCOUNTER — Other Ambulatory Visit: Payer: Self-pay

## 2019-04-12 NOTE — Telephone Encounter (Signed)
Pt needs copy of covid test faxed to his job at : [phone] attention: Loni Beckwith   Sent request to nurse triage.   [place]

## 2019-04-12 NOTE — Telephone Encounter (Signed)
Result faxed as requested. 

## 2019-04-27 ENCOUNTER — Telehealth: Payer: Self-pay | Admitting: Gerontology

## 2019-04-27 ENCOUNTER — Ambulatory Visit: Payer: Self-pay | Admitting: Gerontology

## 2019-04-27 NOTE — Telephone Encounter (Signed)
Called to reschedule 1/28 appt and LVM

## 2019-05-08 ENCOUNTER — Other Ambulatory Visit: Payer: Self-pay

## 2019-05-08 DIAGNOSIS — B182 Chronic viral hepatitis C: Secondary | ICD-10-CM

## 2019-05-09 ENCOUNTER — Telehealth: Payer: Self-pay

## 2019-05-09 NOTE — Telephone Encounter (Signed)
Patient now lives in [place] per Dr Allen Norris referral note to [place]. I left a voicemail for patient giving [facility] phone number for patient to establish PCP in [place]. I will cancel 05/24/2019 appt.

## 2019-05-18 ENCOUNTER — Ambulatory Visit: Payer: Self-pay | Admitting: Gastroenterology

## 2019-05-24 ENCOUNTER — Ambulatory Visit: Payer: Self-pay | Admitting: Internal Medicine

## 2019-05-24 ENCOUNTER — Ambulatory Visit: Payer: Self-pay | Admitting: Gerontology

## 2020-03-09 ENCOUNTER — Other Ambulatory Visit: Payer: Self-pay

## 2020-03-09 ENCOUNTER — Encounter (HOSPITAL_COMMUNITY): Payer: Self-pay | Admitting: Emergency Medicine

## 2020-03-09 ENCOUNTER — Emergency Department (HOSPITAL_COMMUNITY)
Admission: EM | Admit: 2020-03-09 | Discharge: 2020-03-09 | Disposition: A | Discharge status: Home / Self Care | Payer: Self-pay | Attending: Emergency Medicine | Admitting: Emergency Medicine

## 2020-03-09 DIAGNOSIS — Z5321 Procedure and treatment not carried out due to patient leaving prior to being seen by health care provider: Secondary | ICD-10-CM | POA: Insufficient documentation

## 2020-03-09 DIAGNOSIS — Z4800 Encounter for change or removal of nonsurgical wound dressing: Secondary | ICD-10-CM | POA: Insufficient documentation

## 2020-03-09 DIAGNOSIS — W228XXD Striking against or struck by other objects, subsequent encounter: Secondary | ICD-10-CM | POA: Insufficient documentation

## 2020-03-09 DIAGNOSIS — S61212D Laceration without foreign body of right middle finger without damage to nail, subsequent encounter: Secondary | ICD-10-CM | POA: Insufficient documentation

## 2020-03-09 NOTE — ED Notes (Signed)
Patient called for room placement x1 with no answer. 

## 2020-03-09 NOTE — ED Triage Notes (Signed)
Patient reports laceration to right middle finger x2 days ago after hitting it on dog cage. Swelling noted. Reports pain worsening at night. States last tetanus 5-10 years ago.

## 2020-03-09 NOTE — ED Notes (Signed)
Pt called 3x for room placement. Eloped from waiting area. VS stable.

## 2020-03-10 ENCOUNTER — Other Ambulatory Visit: Payer: Self-pay

## 2020-03-10 ENCOUNTER — Encounter (HOSPITAL_COMMUNITY): Payer: Self-pay | Admitting: Emergency Medicine

## 2020-03-10 ENCOUNTER — Emergency Department (HOSPITAL_COMMUNITY): Payer: Self-pay

## 2020-03-10 ENCOUNTER — Emergency Department (HOSPITAL_COMMUNITY)
Admission: EM | Admit: 2020-03-10 | Discharge: 2020-03-10 | Disposition: A | Discharge status: Home / Self Care | Payer: Self-pay | Attending: Emergency Medicine | Admitting: Emergency Medicine

## 2020-03-10 DIAGNOSIS — Y9289 Other specified places as the place of occurrence of the external cause: Secondary | ICD-10-CM | POA: Insufficient documentation

## 2020-03-10 DIAGNOSIS — Y9389 Activity, other specified: Secondary | ICD-10-CM | POA: Insufficient documentation

## 2020-03-10 DIAGNOSIS — F17210 Nicotine dependence, cigarettes, uncomplicated: Secondary | ICD-10-CM | POA: Insufficient documentation

## 2020-03-10 DIAGNOSIS — S60942A Unspecified superficial injury of right middle finger, initial encounter: Secondary | ICD-10-CM | POA: Insufficient documentation

## 2020-03-10 DIAGNOSIS — S6991XA Unspecified injury of right wrist, hand and finger(s), initial encounter: Secondary | ICD-10-CM

## 2020-03-10 DIAGNOSIS — L089 Local infection of the skin and subcutaneous tissue, unspecified: Secondary | ICD-10-CM

## 2020-03-10 DIAGNOSIS — W231XXA Caught, crushed, jammed, or pinched between stationary objects, initial encounter: Secondary | ICD-10-CM | POA: Insufficient documentation

## 2020-03-10 MED ORDER — SULFAMETHOXAZOLE-TRIMETHOPRIM 800-160 MG PO TABS: 1.0000 | ORAL_TABLET | Freq: Two times a day (BID) | ORAL | 0 refills | Status: AC
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
## 2020-03-10 NOTE — ED Triage Notes (Signed)
Pt reports laceration to right middle finger 3 days ago from hitting on dog cage. Reports put super glue on it. Having pains and swelling at joint and unable to bend finger  Last tatanus within 5 years.

## 2020-03-10 NOTE — Discharge Instructions (Addendum)
Take antibiotics as prescribed.  Take entire course, even if your symptoms improve. Continue using warm compresses 3 times a day to help with pain and swelling. Continue taking Tylenol for pain control. Follow-up with a hand doctor listed below if your symptoms not improving after being on antibiotics more than 2 days. Return to the emergency room if you develop high fevers, severe worsening symptoms, redness spreading into your hand or wrist, or if any new, worsening, or concerning symptoms

## 2020-08-30 ENCOUNTER — Other Ambulatory Visit: Payer: Self-pay

## 2020-08-30 ENCOUNTER — Encounter (HOSPITAL_COMMUNITY): Payer: Self-pay | Admitting: Emergency Medicine

## 2020-08-30 ENCOUNTER — Ambulatory Visit (HOSPITAL_COMMUNITY)
Admission: EM | Admit: 2020-08-30 | Discharge: 2020-08-30 | Disposition: A | Discharge status: Home / Self Care | Payer: Self-pay | Attending: Medical Oncology | Admitting: Medical Oncology

## 2020-08-30 ENCOUNTER — Ambulatory Visit (HOSPITAL_COMMUNITY): Admission: EM | Admit: 2020-08-30 | Discharge: 2020-08-30 | Discharge status: Against Medical Advice | Payer: Self-pay

## 2020-08-30 DIAGNOSIS — H6191 Disorder of right external ear, unspecified: Secondary | ICD-10-CM

## 2020-08-30 DIAGNOSIS — T161XXA Foreign body in right ear, initial encounter: Secondary | ICD-10-CM
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
## 2021-06-30 ENCOUNTER — Encounter (HOSPITAL_COMMUNITY): Payer: Self-pay | Admitting: Emergency Medicine

## 2021-06-30 ENCOUNTER — Ambulatory Visit (INDEPENDENT_AMBULATORY_CARE_PROVIDER_SITE_OTHER): Payer: Self-pay

## 2021-06-30 ENCOUNTER — Other Ambulatory Visit: Payer: Self-pay

## 2021-06-30 ENCOUNTER — Ambulatory Visit (HOSPITAL_COMMUNITY)
Admission: EM | Admit: 2021-06-30 | Discharge: 2021-06-30 | Disposition: A | Discharge status: Home / Self Care | Payer: Self-pay | Attending: Family Medicine | Admitting: Family Medicine

## 2021-06-30 DIAGNOSIS — M7989 Other specified soft tissue disorders: Secondary | ICD-10-CM

## 2021-06-30 DIAGNOSIS — M79605 Pain in left leg: Secondary | ICD-10-CM
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
## 2021-06-30 NOTE — Discharge Instructions (Addendum)
You were seen today for left leg pain.  ?The xray done today does show an abnormality, but not clear what it is.  They recommend a CT or MRI for further evaluation. We cannot do that in the urgent care.  Because this has come on so quickly, and you are in so much pain, I recommend you go to the ER for further evaluation today.  ? ?

## 2021-06-30 NOTE — ED Triage Notes (Signed)
TC to Mobile phone listed in Pt chart. There was no answer . ?

## 2021-06-30 NOTE — ED Triage Notes (Signed)
Patient reports a knot on left lower leg, anterior.  Points to area below knee.  Patient states the lower leg feels like a tooth ache. No known injury.    ?

## 2021-07-03 ENCOUNTER — Emergency Department (HOSPITAL_COMMUNITY)
Admission: EM | Admit: 2021-07-03 | Discharge: 2021-07-03 | Disposition: A | Discharge status: Home / Self Care | Payer: Commercial Managed Care - HMO | Attending: Emergency Medicine | Admitting: Emergency Medicine

## 2021-07-03 ENCOUNTER — Other Ambulatory Visit: Payer: Self-pay

## 2021-07-03 ENCOUNTER — Encounter (HOSPITAL_COMMUNITY): Payer: Self-pay

## 2021-07-03 ENCOUNTER — Emergency Department (HOSPITAL_COMMUNITY): Payer: Commercial Managed Care - HMO

## 2021-07-03 DIAGNOSIS — Z5321 Procedure and treatment not carried out due to patient leaving prior to being seen by health care provider: Secondary | ICD-10-CM | POA: Diagnosis not present

## 2021-07-03 DIAGNOSIS — M79662 Pain in left lower leg: Secondary | ICD-10-CM | POA: Diagnosis present

## 2021-07-03 LAB — CBC WITH DIFFERENTIAL/PLATELET
Abs Immature Granulocytes: 0.01 10*3/uL (ref 0.00–0.07)
Basophils Absolute: 0 10*3/uL (ref 0.0–0.1)
Basophils Relative: 1 %
Eosinophils Absolute: 0.2 10*3/uL (ref 0.0–0.5)
Eosinophils Relative: 4 %
HCT: 43 % (ref 39.0–52.0)
Hemoglobin: 14.5 g/dL (ref 13.0–17.0)
Immature Granulocytes: 0 %
Lymphocytes Relative: 52 %
Lymphs Abs: 2.2 10*3/uL (ref 0.7–4.0)
MCH: 32.7 pg (ref 26.0–34.0)
MCHC: 33.7 g/dL (ref 30.0–36.0)
MCV: 97.1 fL (ref 80.0–100.0)
Monocytes Absolute: 0.4 10*3/uL (ref 0.1–1.0)
Monocytes Relative: 9 %
Neutro Abs: 1.4 10*3/uL — ABNORMAL LOW (ref 1.7–7.7)
Neutrophils Relative %: 34 %
Platelets: 277 10*3/uL (ref 150–400)
RBC: 4.43 MIL/uL (ref 4.22–5.81)
RDW: 12.3 % (ref 11.5–15.5)
WBC: 4.2 10*3/uL (ref 4.0–10.5)
nRBC: 0 % (ref 0.0–0.2)

## 2021-07-03 LAB — BASIC METABOLIC PANEL
Anion gap: 4 — ABNORMAL LOW (ref 5–15)
BUN: 11 mg/dL (ref 6–20)
CO2: 30 mmol/L (ref 22–32)
Calcium: 9.3 mg/dL (ref 8.9–10.3)
Chloride: 104 mmol/L (ref 98–111)
Creatinine, Ser: 0.81 mg/dL (ref 0.61–1.24)
GFR, Estimated: >60 mL/min (ref >60)
Glucose, Bld: 109 mg/dL — ABNORMAL HIGH (ref 70–99)
Potassium: 3.9 mmol/L (ref 3.5–5.1)
Sodium: 138 mmol/L (ref 135–145)

## 2021-07-03 NOTE — ED Notes (Signed)
Pt called 3x, no answer.  

## 2021-07-03 NOTE — ED Triage Notes (Signed)
Pt was sent from UC today to have CT or MRI for further evaluation of his left leg pain and a knot to anterior shin below his knee that has been there for about a week.Denies injury. UC did an xray but can not rule out abnormality. He also reports dental pain. ?

## 2021-07-03 NOTE — ED Notes (Signed)
Called 3x for room, no answer ? ?

## 2021-07-03 NOTE — ED Provider Triage Note (Signed)
Emergency Medicine Provider Triage Evaluation Note ? ?Richard Bauer , Bauer 61 y.o. male  was evaluated in triage.  Pt complains of left lower leg pain onset 1 week ago.  Patient was evaluated at urgent care on 06/30/2021 and at that time had an x-ray completed that was inconclusive.  He was told to come to the emergency department for further evaluation of his symptoms.  Patient notes that instead of coming to the emergency department he decided to try Tiger balm which normally helps for his symptoms however it did not help him.  Denies fever, chills, redness, color change.  Denies recent injury, trauma, fall. ? ?Review of Systems  ?Positive: As per HPI above ?Negative:  ? ?Physical Exam  ?BP 110/75 (BP Location: Left Arm)   Pulse 80   Temp 98.4 ?F (36.9 ?C) (Oral)   Resp 14   SpO2 97%  ?Gen:   Awake, no distress   ?Resp:  Normal effort  ?MSK:   Moves extremities without difficulty  ?Other:  Increased warmth noted to left shin.  Area of soft tissue swelling inferior to left patella with mild tenderness to palpation.  No obvious erythema noted. ? ?Medical Decision Making  ?Medically screening exam initiated at 4:46 PM.  Appropriate orders placed.  Richard Bauer was informed that the remainder of the evaluation will be completed by another provider, this initial triage assessment does not replace that evaluation, and the importance of remaining in the ED until their evaluation is complete. ?  ?Richard Gudrun Axe A, PA-C ?07/03/21 1649 ? ?

## 2022-02-27 DIAGNOSIS — Z419 Encounter for procedure for purposes other than remedying health state, unspecified: Secondary | ICD-10-CM | POA: Diagnosis not present

## 2022-03-30 DIAGNOSIS — Z419 Encounter for procedure for purposes other than remedying health state, unspecified: Secondary | ICD-10-CM | POA: Diagnosis not present

## 2023-07-17 ENCOUNTER — Encounter (HOSPITAL_BASED_OUTPATIENT_CLINIC_OR_DEPARTMENT_OTHER): Payer: Self-pay

## 2023-07-17 ENCOUNTER — Emergency Department (HOSPITAL_BASED_OUTPATIENT_CLINIC_OR_DEPARTMENT_OTHER): Admitting: Radiology

## 2023-07-17 ENCOUNTER — Other Ambulatory Visit: Payer: Self-pay

## 2023-07-17 ENCOUNTER — Emergency Department (HOSPITAL_BASED_OUTPATIENT_CLINIC_OR_DEPARTMENT_OTHER)
Admission: EM | Admit: 2023-07-17 | Discharge: 2023-07-17 | Disposition: A | Discharge status: Home / Self Care | Attending: Emergency Medicine | Admitting: Emergency Medicine

## 2023-07-17 DIAGNOSIS — M542 Cervicalgia: Secondary | ICD-10-CM | POA: Diagnosis present

## 2023-07-17 DIAGNOSIS — Y92410 Unspecified street and highway as the place of occurrence of the external cause: Secondary | ICD-10-CM | POA: Diagnosis not present

## 2023-07-17 MED ORDER — ACETAMINOPHEN 500 MG PO TABS
1000.0000 mg | ORAL_TABLET | Freq: Once | ORAL | Status: AC
  Administered 2023-07-17: 1000 mg via ORAL
  Filled 2023-07-17: qty 2

## 2023-07-17 MED ORDER — ACETAMINOPHEN 500 MG PO TABS: 1000.0000 mg | ORAL_TABLET | Freq: Four times a day (QID) | ORAL | 0 refills | Status: AC | PRN

## 2023-07-17 MED ORDER — CYCLOBENZAPRINE HCL 10 MG PO TABS: 10.0000 mg | ORAL_TABLET | Freq: Two times a day (BID) | ORAL | 0 refills | Status: DC | PRN
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
## 2023-07-17 NOTE — ED Notes (Signed)
 Reviewed AVS/discharge instruction with patient. Time allotted for and all questions answered. Patient is agreeable for d/c and escorted to ed exit by staff.
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
## 2023-12-21 ENCOUNTER — Ambulatory Visit (HOSPITAL_COMMUNITY)

## 2023-12-21 ENCOUNTER — Other Ambulatory Visit: Payer: Self-pay

## 2023-12-21 ENCOUNTER — Emergency Department (HOSPITAL_COMMUNITY)
Admission: EM | Admit: 2023-12-21 | Discharge: 2023-12-21 | Discharge status: Against Medical Advice | Source: Ambulatory Visit | Attending: Emergency Medicine | Admitting: Emergency Medicine

## 2023-12-21 ENCOUNTER — Emergency Department (HOSPITAL_COMMUNITY)

## 2023-12-21 ENCOUNTER — Encounter (HOSPITAL_COMMUNITY): Payer: Self-pay

## 2023-12-21 DIAGNOSIS — Y92410 Unspecified street and highway as the place of occurrence of the external cause: Secondary | ICD-10-CM | POA: Diagnosis not present

## 2023-12-21 DIAGNOSIS — S161XXA Strain of muscle, fascia and tendon at neck level, initial encounter: Secondary | ICD-10-CM | POA: Diagnosis not present

## 2023-12-21 DIAGNOSIS — R202 Paresthesia of skin: Secondary | ICD-10-CM | POA: Insufficient documentation

## 2023-12-21 DIAGNOSIS — Z5329 Procedure and treatment not carried out because of patient's decision for other reasons: Secondary | ICD-10-CM | POA: Diagnosis not present

## 2023-12-21 DIAGNOSIS — R519 Headache, unspecified: Secondary | ICD-10-CM | POA: Insufficient documentation

## 2023-12-21 DIAGNOSIS — M542 Cervicalgia: Secondary | ICD-10-CM | POA: Diagnosis present

## 2023-12-21 LAB — CBC WITH DIFFERENTIAL/PLATELET
Abs Immature Granulocytes: 0.01 K/uL (ref 0.00–0.07)
Basophils Absolute: 0 K/uL (ref 0.0–0.1)
Basophils Relative: 1 %
Eosinophils Absolute: 0.1 K/uL (ref 0.0–0.5)
Eosinophils Relative: 4 %
HCT: 40.2 % (ref 39.0–52.0)
Hemoglobin: 13 g/dL (ref 13.0–17.0)
Immature Granulocytes: 0 %
Lymphocytes Relative: 50 %
Lymphs Abs: 1.7 K/uL (ref 0.7–4.0)
MCH: 32.1 pg (ref 26.0–34.0)
MCHC: 32.3 g/dL (ref 30.0–36.0)
MCV: 99.3 fL (ref 80.0–100.0)
Monocytes Absolute: 0.4 K/uL (ref 0.1–1.0)
Monocytes Relative: 10 %
Neutro Abs: 1.2 K/uL — ABNORMAL LOW (ref 1.7–7.7)
Neutrophils Relative %: 35 %
Platelets: 278 K/uL (ref 150–400)
RBC: 4.05 MIL/uL — ABNORMAL LOW (ref 4.22–5.81)
RDW: 13.1 % (ref 11.5–15.5)
WBC: 3.4 K/uL — ABNORMAL LOW (ref 4.0–10.5)
nRBC: 0 % (ref 0.0–0.2)

## 2023-12-21 LAB — COMPREHENSIVE METABOLIC PANEL WITH GFR
ALT: 27 U/L (ref 0–44)
AST: 31 U/L (ref 15–41)
Albumin: 3.9 g/dL (ref 3.5–5.0)
Alkaline Phosphatase: 71 U/L (ref 38–126)
Anion gap: 10 (ref 5–15)
BUN: 15 mg/dL (ref 8–23)
CO2: 24 mmol/L (ref 22–32)
Calcium: 9.2 mg/dL (ref 8.9–10.3)
Chloride: 105 mmol/L (ref 98–111)
Creatinine, Ser: 0.65 mg/dL (ref 0.61–1.24)
GFR, Estimated: >60 mL/min (ref >60)
Glucose, Bld: 95 mg/dL (ref 70–99)
Potassium: 4.1 mmol/L (ref 3.5–5.1)
Sodium: 139 mmol/L (ref 135–145)
Total Bilirubin: 0.5 mg/dL (ref 0.0–1.2)
Total Protein: 6.6 g/dL (ref 6.5–8.1)

## 2023-12-21 MED ORDER — METHOCARBAMOL 750 MG PO TABS: 750.0000 mg | ORAL_TABLET | Freq: Three times a day (TID) | ORAL | 0 refills | Status: AC | PRN

## 2023-12-21 NOTE — ED Notes (Signed)
 Urine sample collected and sent to lab.
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
## 2023-12-21 NOTE — Discharge Instructions (Signed)
 It was our pleasure to provide your ER care today - we hope that you feel better.  Take acetaminophen  or ibuprofen as need for pain. You may also take robaxin  as need for muscle pain/spasm - no driving when taking.   Follow up with primary care doctor in 1-2 weeks if symptoms fail to improve/resolve.  Return to ER if worse, new symptoms, new/severe pain, trouble breathing, change in speech or vision, new numbness/weakness, or other emergency concern.

## 2023-12-21 NOTE — ED Provider Notes (Signed)
 Richard Bauer, assumed care for this patient.  In brief 63 year old male here today for neck pain, headache, numbness and tingling in the hands.  Patient was signed out to me pending MRI.  Was informed by nursing staff that patient needed to leave while he was waiting for his MRI as he had some ready to be.  I did not personally evaluate this patient, however I did see him walking in the hallway.   [name], DO 12/21/23 [phone]
